# Patient Record
Sex: Female | Born: 1985 | Race: White | Hispanic: No | Marital: Married | Smoking: Never smoker
Health system: Southern US, Community
[De-identification: ages and names within clinical notes are randomized; demographics above are authoritative.]

## PROBLEM LIST (undated history)

## (undated) DIAGNOSIS — F32A Depression, unspecified: Secondary | ICD-10-CM

## (undated) DIAGNOSIS — T7840XA Allergy, unspecified, initial encounter: Secondary | ICD-10-CM

## (undated) DIAGNOSIS — F329 Major depressive disorder, single episode, unspecified: Secondary | ICD-10-CM

## (undated) HISTORY — DX: Depression, unspecified: F32.A

## (undated) HISTORY — DX: Allergy, unspecified, initial encounter: T78.40XA

## (undated) HISTORY — DX: Major depressive disorder, single episode, unspecified: F32.9

---

## 2017-01-06 ENCOUNTER — Ambulatory Visit (INDEPENDENT_AMBULATORY_CARE_PROVIDER_SITE_OTHER): Payer: BC Managed Care – PPO | Admitting: Physician Assistant

## 2017-01-06 ENCOUNTER — Encounter: Payer: Self-pay | Admitting: Physician Assistant

## 2017-01-06 VITALS — BP 115/72 | HR 100 | Temp 98.6°F | Resp 18 | Ht 64.0 in | Wt 142.8 lb

## 2017-01-06 DIAGNOSIS — Z130 Encounter for screening for diseases of the blood and blood-forming organs and certain disorders involving the immune mechanism: Secondary | ICD-10-CM | POA: Diagnosis not present

## 2017-01-06 DIAGNOSIS — Z13228 Encounter for screening for other metabolic disorders: Secondary | ICD-10-CM | POA: Diagnosis not present

## 2017-01-06 DIAGNOSIS — Z1329 Encounter for screening for other suspected endocrine disorder: Secondary | ICD-10-CM

## 2017-01-06 DIAGNOSIS — Z13220 Encounter for screening for lipoid disorders: Secondary | ICD-10-CM

## 2017-01-06 DIAGNOSIS — L989 Disorder of the skin and subcutaneous tissue, unspecified: Secondary | ICD-10-CM

## 2017-01-06 DIAGNOSIS — Z0000 Encounter for general adult medical examination without abnormal findings: Secondary | ICD-10-CM

## 2017-01-06 NOTE — Progress Notes (Signed)
Norma Gray  MRN: [id number] DOB: April 04, 1986  Subjective:  Pt is a 31 y.o. female who presents for annual physical exam.  Social: Works as [employer] fast [employer]. Lives in [place]. Sexually active with monogamous partner.  Diet: Eats lots of vegetables, eats minimal meats, likes dairy. Drinks mostly water.  Exercise: Walking, swimming, and stretching.  Menstrual Cycle: Pretty regular, light bleeding for a few days. Has Skyla IUD. Needs it removed sometime this year. Sleep:Gets about 7-8 hours. Feels rested throughout the day. Bowel Movments:Has one every day, normal for her.    Last annual physical exam: 2016 Last dental exam: 10/2016 Last vision exam: 12/2016 Last pap smear:  07/2015  Vaccinations      Tetanus: Thinks she had it in 2014.       HPV: Completed series   Chronic Issues: 1)Depression: Followed by psychiatrist, Dr. Justin Mend, in [place]. Controlled on lithium, prozac, lamictal, and vit d.  2) Muscle/back pain: Followed by integrative PT  There are no active problems to display for this patient.   No current outpatient prescriptions on file prior to visit.   No current facility-administered medications on file prior to visit.     No Known Allergies  Social History   Social History  . Marital status: Single    Spouse name: N/A  . Number of children: N/A  . Years of education: N/A   Social History Main Topics  . Smoking status: Never Smoker  . Smokeless tobacco: Never Used  . Alcohol use None  . Drug use: Unknown  . Sexual activity: Not Asked   Other Topics Concern  . None   Social History Narrative  . None    No past surgical history on file.  Family History  Problem Relation Age of Onset  . Hyperlipidemia Mother   . Hyperlipidemia Father   . Hypertension Father   . Cancer Maternal Grandmother   . Cancer Paternal Grandmother   . Hyperlipidemia Paternal Grandmother   . Hypertension Paternal Grandmother   . Cancer Paternal  Grandfather   . Hyperlipidemia Paternal Grandfather     Review of Systems  Constitutional: Negative for activity change, appetite change, chills, diaphoresis, fatigue, fever and unexpected weight change.  HENT: Negative for congestion, dental problem, drooling, ear discharge, ear pain, facial swelling, hearing loss, mouth sores, nosebleeds, postnasal drip, rhinorrhea, sinus pain, sinus pressure, sneezing, sore throat, tinnitus, trouble swallowing and voice change.   Eyes: Negative for photophobia, pain, discharge, redness, itching and visual disturbance.  Respiratory: Negative for apnea, cough, choking, chest tightness, shortness of breath, wheezing and stridor.   Cardiovascular: Negative for chest pain, palpitations and leg swelling.  Gastrointestinal: Negative for abdominal distention, abdominal pain, anal bleeding, blood in stool, constipation, diarrhea, nausea, rectal pain and vomiting.  Endocrine: Negative for cold intolerance, heat intolerance, polydipsia, polyphagia and polyuria.  Genitourinary: Negative for decreased urine volume, difficulty urinating, dyspareunia, dysuria, enuresis, flank pain, frequency, genital sores, hematuria, menstrual problem, pelvic pain, urgency, vaginal bleeding, vaginal discharge and vaginal pain.  Musculoskeletal: Positive for back pain and myalgias. Negative for arthralgias, gait problem, joint swelling, neck pain and neck stiffness.  Skin: Negative for color change, pallor, rash and wound.  Allergic/Immunologic: Positive for environmental allergies. Negative for food allergies and immunocompromised state.  Neurological: Negative for dizziness, tremors, seizures, syncope, facial asymmetry, speech difficulty, weakness, light-headedness, numbness and headaches.  Hematological: Negative for adenopathy. Does not bruise/bleed easily.  Psychiatric/Behavioral: Negative for agitation, behavioral problems, confusion, decreased concentration, dysphoric mood,  hallucinations,  self-injury, sleep disturbance and suicidal ideas. The patient is not nervous/anxious and is not hyperactive.     Objective:  BP 115/72   Pulse 100   Temp 98.6 F (37 C) (Oral)   Resp 18   Ht 5' 4"  (1.626 m)   Wt 142 lb 12.8 oz (64.8 kg)   LMP 12/19/2016   SpO2 98%   BMI 24.51 kg/m   Physical Exam  Constitutional: She is oriented to person, place, and time and well-developed, well-nourished, and in no distress.  HENT:  Head: Normocephalic and atraumatic.  Right Ear: Hearing, tympanic membrane, external ear and ear canal normal.  Left Ear: Hearing, tympanic membrane, external ear and ear canal normal.  Nose: Nose normal.  Mouth/Throat: Uvula is midline, oropharynx is clear and moist and mucous membranes are normal. No oropharyngeal exudate.  Eyes: Conjunctivae, EOM and lids are normal. Pupils are equal, round, and reactive to light. No scleral icterus.  Neck: Trachea normal and normal range of motion. No thyroid mass and no thyromegaly present.  Cardiovascular: Normal rate, regular rhythm, normal heart sounds and intact distal pulses.   Pulmonary/Chest: Effort normal and breath sounds normal. Right breast exhibits no inverted nipple, no mass, no nipple discharge, no skin change and no tenderness. Left breast exhibits no inverted nipple, no mass, no nipple discharge, no skin change and no tenderness. Breasts are symmetrical.    Abdominal: Soft. Normal appearance and bowel sounds are normal. There is no tenderness.  Lymphadenopathy:       Head (right side): No tonsillar, no preauricular, no posterior auricular and no occipital adenopathy present.       Head (left side): No tonsillar, no preauricular, no posterior auricular and no occipital adenopathy present.    She has no cervical adenopathy.       Right: No supraclavicular adenopathy present.       Left: No supraclavicular adenopathy present.  Neurological: She is alert and oriented to person, place, and time. She  has normal sensation, normal strength and normal reflexes. Gait normal.  Skin: Skin is warm and dry.  Psychiatric: Affect normal.    Visual Acuity Screening   Right eye Left eye Both eyes  Without correction: 20/13 20/13 20/13   With correction:       Assessment and Plan :  Discussed healthy lifestyle, diet, exercise, preventative care, vaccinations, and addressed patient's concerns. Plan for follow up in one year. Otherwise, plan for specific conditions below.  1. Annual physical exam Await lab results   2. Screening, anemia, deficiency, iron - CBC with Differential/Platelet  3. Screening for metabolic disorder - CNO70+JGGE  4. Screening, lipid - Lipid panel  5. Screening for thyroid disorder - TSH  6. Skin lesion - Ambulatory referral to Dermatology   Tenna Delaine, PA-C  [facility] [place] 01/06/2017 12:34 PM

## 2017-01-06 NOTE — Patient Instructions (Addendum)
Please check records for Tdap vaccine.   For IUD, one provider here, Dr. Nolon Rod, does remove and place IUDs. Our office does not order these. The way you go about this is to contact your insurance and see how you can get the [facility] IUD (they may send it to your pharmacy and you pick it up to bring here). Once you find this out, call our office and schedule an appointment with Dr. Nolon Rod for IUD removal and placement.   For derm, they should contact you in a couple of weeks.  In terms of your lab results, we will have your results in a few days. In the meantime, try to sign up for mychart so we can communicate through there.  Thank you for letting me participate in your health and well being.   Health Maintenance, Female Adopting a healthy lifestyle and getting preventive care can go a long way to promote health and wellness. Talk with your health care provider about what schedule of regular examinations is right for you. This is a good chance for you to check in with your provider about disease prevention and staying healthy. In between checkups, there are plenty of things you can do on your own. Experts have done a lot of research about which lifestyle changes and preventive measures are most likely to keep you healthy. Ask your health care provider for more information. Weight and diet Eat a healthy diet  Be sure to include plenty of vegetables, fruits, low-fat dairy products, and lean protein.  Do not eat a lot of foods high in solid fats, added sugars, or salt.  Get regular exercise. This is one of the most important things you can do for your health.  Most adults should exercise for at least 150 minutes each week. The exercise should increase your heart rate and make you sweat (moderate-intensity exercise).  Most adults should also do strengthening exercises at least twice a week. This is in addition to the moderate-intensity exercise. Maintain a healthy weight  Body mass index  (BMI) is a measurement that can be used to identify possible weight problems. It estimates body fat based on height and weight. Your health care provider can help determine your BMI and help you achieve or maintain a healthy weight.  For females 7 years of age and older:  A BMI below 18.5 is considered underweight.  A BMI of 18.5 to 24.9 is normal.  A BMI of 25 to 29.9 is considered overweight.  A BMI of 30 and above is considered obese. Watch levels of cholesterol and blood lipids  You should start having your blood tested for lipids and cholesterol at 30 years of age, then have this test every 5 years.  You may need to have your cholesterol levels checked more often if:  Your lipid or cholesterol levels are high.  You are older than 30 years of age.  You are at high risk for heart disease. Cancer screening Lung Cancer  Lung cancer screening is recommended for adults 71-84 years old who are at high risk for lung cancer because of a history of smoking.  A yearly low-dose CT scan of the lungs is recommended for people who:  Currently smoke.  Have quit within the past 15 years.  Have at least a 30-pack-year history of smoking. A pack year is smoking an average of one pack of cigarettes a day for 1 year.  Yearly screening should continue until it has been 15 years since you quit.  Yearly screening should stop if you develop a health problem that would prevent you from having lung cancer treatment. Breast Cancer  Practice breast self-awareness. This means understanding how your breasts normally appear and feel.  It also means doing regular breast self-exams. Let your health care provider know about any changes, no matter how small.  If you are in your 20s or 30s, you should have a clinical breast exam (CBE) by a health care provider every 1-3 years as part of a regular health exam.  If you are 68 or older, have a CBE every year. Also consider having a breast X-ray  (mammogram) every year.  If you have a family history of breast cancer, talk to your health care provider about genetic screening.  If you are at high risk for breast cancer, talk to your health care provider about having an MRI and a mammogram every year.  Breast cancer gene (BRCA) assessment is recommended for women who have family members with BRCA-related cancers. BRCA-related cancers include:  Breast.  Ovarian.  Tubal.  Peritoneal cancers.  Results of the assessment will determine the need for genetic counseling and BRCA1 and BRCA2 testing. Cervical Cancer  Your health care provider may recommend that you be screened regularly for cancer of the pelvic organs (ovaries, uterus, and vagina). This screening involves a pelvic examination, including checking for microscopic changes to the surface of your cervix (Pap test). You may be encouraged to have this screening done every 3 years, beginning at age 43.  For women ages 16-65, health care providers may recommend pelvic exams and Pap testing every 3 years, or they may recommend the Pap and pelvic exam, combined with testing for human papilloma virus (HPV), every 5 years. Some types of HPV increase your risk of cervical cancer. Testing for HPV may also be done on women of any age with unclear Pap test results.  Other health care providers may not recommend any screening for nonpregnant women who are considered low risk for pelvic cancer and who do not have symptoms. Ask your health care provider if a screening pelvic exam is right for you.  If you have had past treatment for cervical cancer or a condition that could lead to cancer, you need Pap tests and screening for cancer for at least 20 years after your treatment. If Pap tests have been discontinued, your risk factors (such as having a new sexual partner) need to be reassessed to determine if screening should resume. Some women have medical problems that increase the chance of getting  cervical cancer. In these cases, your health care provider may recommend more frequent screening and Pap tests. Colorectal Cancer  This type of cancer can be detected and often prevented.  Routine colorectal cancer screening usually begins at 30 years of age and continues through 30 years of age.  Your health care provider may recommend screening at an earlier age if you have risk factors for colon cancer.  Your health care provider may also recommend using home test kits to check for hidden blood in the stool.  A small camera at the end of a tube can be used to examine your colon directly (sigmoidoscopy or colonoscopy). This is done to check for the earliest forms of colorectal cancer.  Routine screening usually begins at age 33.  Direct examination of the colon should be repeated every 5-10 years through 30 years of age. However, you may need to be screened more often if early forms of precancerous polyps or  small growths are found. Skin Cancer  Check your skin from head to toe regularly.  Tell your health care provider about any new moles or changes in moles, especially if there is a change in a mole's shape or color.  Also tell your health care provider if you have a mole that is larger than the size of a pencil eraser.  Always use sunscreen. Apply sunscreen liberally and repeatedly throughout the day.  Protect yourself by wearing long sleeves, pants, a wide-brimmed hat, and sunglasses whenever you are outside. Heart disease, diabetes, and high blood pressure  High blood pressure causes heart disease and increases the risk of stroke. High blood pressure is more likely to develop in:  People who have blood pressure in the high end of the normal range (130-139/85-89 mm Hg).  People who are overweight or obese.  People who are African American.  If you are 43-38 years of age, have your blood pressure checked every 3-5 years. If you are 34 years of age or older, have your blood  pressure checked every year. You should have your blood pressure measured twice-once when you are at a hospital or clinic, and once when you are not at a hospital or clinic. Record the average of the two measurements. To check your blood pressure when you are not at a hospital or clinic, you can use:  An automated blood pressure machine at a pharmacy.  A home blood pressure monitor.  If you are between 88 years and 41 years old, ask your health care provider if you should take aspirin to prevent strokes.  Have regular diabetes screenings. This involves taking a blood sample to check your fasting blood sugar level.  If you are at a normal weight and have a low risk for diabetes, have this test once every three years after 30 years of age.  If you are overweight and have a high risk for diabetes, consider being tested at a younger age or more often. Preventing infection Hepatitis B  If you have a higher risk for hepatitis B, you should be screened for this virus. You are considered at high risk for hepatitis B if:  You were born in a country where hepatitis B is common. Ask your health care provider which countries are considered high risk.  Your parents were born in a high-risk country, and you have not been immunized against hepatitis B (hepatitis B vaccine).  You have HIV or AIDS.  You use needles to inject street drugs.  You live with someone who has hepatitis B.  You have had sex with someone who has hepatitis B.  You get hemodialysis treatment.  You take certain medicines for conditions, including cancer, organ transplantation, and autoimmune conditions. Hepatitis C  Blood testing is recommended for:  Everyone born from [birth year] through 1964.  Anyone with known risk factors for hepatitis C. Sexually transmitted infections (STIs)  You should be screened for sexually transmitted infections (STIs) including gonorrhea and chlamydia if:  You are sexually active and are younger  than 30 years of age.  You are older than 30 years of age and your health care provider tells you that you are at risk for this type of infection.  Your sexual activity has changed since you were last screened and you are at an increased risk for chlamydia or gonorrhea. Ask your health care provider if you are at risk.  If you do not have HIV, but are at risk, it may be recommended that  you take a prescription medicine daily to prevent HIV infection. This is called pre-exposure prophylaxis (PrEP). You are considered at risk if:  You are sexually active and do not regularly use condoms or know the HIV status of your partner(s).  You take drugs by injection.  You are sexually active with a partner who has HIV. Talk with your health care provider about whether you are at high risk of being infected with HIV. If you choose to begin PrEP, you should first be tested for HIV. You should then be tested every 3 months for as long as you are taking PrEP. Pregnancy  If you are premenopausal and you may become pregnant, ask your health care provider about preconception counseling.  If you may become pregnant, take 400 to 800 micrograms (mcg) of folic acid every day.  If you want to prevent pregnancy, talk to your health care provider about birth control (contraception). Osteoporosis and menopause  Osteoporosis is a disease in which the bones lose minerals and strength with aging. This can result in serious bone fractures. Your risk for osteoporosis can be identified using a bone density scan.  If you are 10 years of age or older, or if you are at risk for osteoporosis and fractures, ask your health care provider if you should be screened.  Ask your health care provider whether you should take a calcium or vitamin D supplement to lower your risk for osteoporosis.  Menopause may have certain physical symptoms and risks.  Hormone replacement therapy may reduce some of these symptoms and risks. Talk  to your health care provider about whether hormone replacement therapy is right for you. Follow these instructions at home:  Schedule regular health, dental, and eye exams.  Stay current with your immunizations.  Do not use any tobacco products including cigarettes, chewing tobacco, or electronic cigarettes.  If you are pregnant, do not drink alcohol.  If you are breastfeeding, limit how much and how often you drink alcohol.  Limit alcohol intake to no more than 1 drink per day for nonpregnant women. One drink equals 12 ounces of beer, 5 ounces of wine, or 1 ounces of hard liquor.  Do not use street drugs.  Do not share needles.  Ask your health care provider for help if you need support or information about quitting drugs.  Tell your health care provider if you often feel depressed.  Tell your health care provider if you have ever been abused or do not feel safe at home. This information is not intended to replace advice given to you by your health care provider. Make sure you discuss any questions you have with your health care provider. Document Released: 02/15/2011 Document Revised: 01/08/2016 Document Reviewed: 05/06/2015 Elsevier Interactive Patient Education  2017 [employer].      IF you received an x-ray today, you will receive an invoice from [facility]. Please contact [facility] at [phone] with questions or concerns regarding your invoice.   IF you received labwork today, you will receive an invoice from [facility]. Please contact [facility] at [phone] with questions or concerns regarding your invoice.   Our billing staff will not be able to assist you with questions regarding bills from these companies.  You will be contacted with the lab results as soon as they are available. The fastest way to get your results is to activate your My Chart account. Instructions are located on the last page of this paperwork. If you have not heard from [place]  regarding the results in 2 weeks, please contact this office.

## 2017-01-07 LAB — LIPID PANEL
CHOLESTEROL TOTAL: 170 mg/dL (ref 100–199)
Chol/HDL Ratio: 3.9 ratio (ref 0.0–4.4)
HDL: 44 mg/dL (ref >39)
LDL CALC: 111 mg/dL — AB (ref 0–99)
Triglycerides: 73 mg/dL (ref 0–149)
VLDL CHOLESTEROL CAL: 15 mg/dL (ref 5–40)

## 2017-01-07 LAB — CMP14+EGFR
ALBUMIN: 4.4 g/dL (ref 3.5–5.5)
ALT: 11 IU/L (ref 0–32)
AST: 13 IU/L (ref 0–40)
Albumin/Globulin Ratio: 1.8 (ref 1.2–2.2)
Alkaline Phosphatase: 51 IU/L (ref 39–117)
BILIRUBIN TOTAL: 0.5 mg/dL (ref 0.0–1.2)
BUN / CREAT RATIO: 20 (ref 9–23)
BUN: 13 mg/dL (ref 6–20)
CHLORIDE: 104 mmol/L (ref 96–106)
CO2: 19 mmol/L (ref 18–29)
CREATININE: 0.65 mg/dL (ref 0.57–1.00)
Calcium: 8.8 mg/dL (ref 8.7–10.2)
GFR calc non Af Amer: 120 mL/min/{1.73_m2} (ref >59)
GFR, EST AFRICAN AMERICAN: 138 mL/min/{1.73_m2} (ref >59)
GLUCOSE: 87 mg/dL (ref 65–99)
Globulin, Total: 2.5 g/dL (ref 1.5–4.5)
Potassium: 4.4 mmol/L (ref 3.5–5.2)
Sodium: 140 mmol/L (ref 134–144)
TOTAL PROTEIN: 6.9 g/dL (ref 6.0–8.5)

## 2017-01-07 LAB — CBC WITH DIFFERENTIAL/PLATELET
BASOS ABS: 0 10*3/uL (ref 0.0–0.2)
Basos: 0 %
EOS (ABSOLUTE): 0.2 10*3/uL (ref 0.0–0.4)
Eos: 1 %
HEMOGLOBIN: 13.5 g/dL (ref 11.1–15.9)
Hematocrit: 39.5 % (ref 34.0–46.6)
IMMATURE GRANS (ABS): 0 10*3/uL (ref 0.0–0.1)
Immature Granulocytes: 0 %
LYMPHS: 12 %
Lymphocytes Absolute: 1.5 10*3/uL (ref 0.7–3.1)
MCH: 31.8 pg (ref 26.6–33.0)
MCHC: 34.2 g/dL (ref 31.5–35.7)
MCV: 93 fL (ref 79–97)
MONOCYTES: 6 %
Monocytes Absolute: 0.8 10*3/uL (ref 0.1–0.9)
NEUTROS ABS: 9.8 10*3/uL — AB (ref 1.4–7.0)
NEUTROS PCT: 81 %
PLATELETS: 233 10*3/uL (ref 150–379)
RBC: 4.25 x10E6/uL (ref 3.77–5.28)
RDW: 13 % (ref 12.3–15.4)
WBC: 12.4 10*3/uL — AB (ref 3.4–10.8)

## 2017-01-07 LAB — TSH: TSH: 1.26 u[IU]/mL (ref 0.450–4.500)

## 2017-01-10 ENCOUNTER — Other Ambulatory Visit: Payer: Self-pay | Admitting: Physician Assistant

## 2017-01-10 DIAGNOSIS — Z862 Personal history of diseases of the blood and blood-forming organs and certain disorders involving the immune mechanism: Secondary | ICD-10-CM

## 2017-01-13 ENCOUNTER — Encounter: Payer: Self-pay | Admitting: Physician Assistant

## 2017-02-21 ENCOUNTER — Ambulatory Visit: Payer: BC Managed Care – PPO | Admitting: Physician Assistant

## 2017-02-23 ENCOUNTER — Encounter: Payer: Self-pay | Admitting: Physician Assistant

## 2017-02-23 ENCOUNTER — Ambulatory Visit (INDEPENDENT_AMBULATORY_CARE_PROVIDER_SITE_OTHER): Payer: BC Managed Care – PPO | Admitting: Physician Assistant

## 2017-02-23 VITALS — BP 104/68 | HR 90 | Resp 16 | Ht 64.0 in | Wt 142.2 lb

## 2017-02-23 DIAGNOSIS — J302 Other seasonal allergic rhinitis: Secondary | ICD-10-CM | POA: Diagnosis not present

## 2017-02-23 DIAGNOSIS — F32A Depression, unspecified: Secondary | ICD-10-CM

## 2017-02-23 DIAGNOSIS — F329 Major depressive disorder, single episode, unspecified: Secondary | ICD-10-CM | POA: Diagnosis not present

## 2017-02-23 DIAGNOSIS — D72829 Elevated white blood cell count, unspecified: Secondary | ICD-10-CM | POA: Diagnosis not present

## 2017-02-23 LAB — POCT URINALYSIS DIP (MANUAL ENTRY)
BILIRUBIN UA: NEGATIVE
BILIRUBIN UA: NEGATIVE mg/dL
Blood, UA: NEGATIVE
GLUCOSE UA: NEGATIVE mg/dL
LEUKOCYTES UA: NEGATIVE
NITRITE UA: NEGATIVE
Protein Ur, POC: NEGATIVE mg/dL
Spec Grav, UA: 1.015 (ref 1.010–1.025)
Urobilinogen, UA: 0.2 E.U./dL
pH, UA: 7.5 (ref 5.0–8.0)

## 2017-02-23 MED ORDER — FLUTICASONE PROPIONATE 50 MCG/ACT NA SUSP: 2.0000 | Freq: Every day | NASAL | 0 refills | Status: DC

## 2017-02-23 MED ORDER — FEXOFENADINE HCL 180 MG PO TABS: 180.0000 mg | ORAL_TABLET | Freq: Every day | ORAL | 0 refills | Status: DC

## 2017-02-23 NOTE — Patient Instructions (Addendum)
Your labs should result in 4-5 days. I will notify you via mychart when these return. For your allergies, I recommend you starting allegra and flonase daily at least for the next two weeks to see if this makes a difference in how you are feeling. I have given you a prescription for both of these so you can pick them up at your pharmacy. Please return if you have any other questions or concerns. Thank you for letting me participate in your health and well being.    IF you received an x-ray today, you will receive an invoice from [facility]. Please contact [facility] at [phone] with questions or concerns regarding your invoice.   IF you received labwork today, you will receive an invoice from [facility]. Please contact [facility] at [phone] with questions or concerns regarding your invoice.   Our billing staff will not be able to assist you with questions regarding bills from these companies.  You will be contacted with the lab results as soon as they are available. The fastest way to get your results is to activate your My Chart account. Instructions are located on the last page of this paperwork. If you have not heard from us regarding the results in 2 weeks, please contact this office.

## 2017-02-23 NOTE — Progress Notes (Signed)
   Norma Gray  MRN: [id number] DOB: [birth year]  Subjective:  Norma Gray is a 31 y.o. female seen in office today for a chief complaint of request of labs. Pt has Rx from psychiatrist, Dr. Mariel CraftSarah Webb, for labs. She has hx of depression and bipolar disorder.  Controlled on fluoxetine 5mg , lamictal 300mg , and lithium 300mg  daily. States she typically has these labs by her PCP office twice a year and will send it to her psychiatrist. No other questions or concerns.   Review of Systems  Constitutional: Negative for chills, diaphoresis and fever.  HENT: Positive for sneezing.   Eyes: Positive for itching.  Respiratory: Negative for cough and shortness of breath.   Cardiovascular: Negative for chest pain and palpitations.  Gastrointestinal: Negative for anal bleeding, nausea and vomiting.  Allergic/Immunologic: Positive for environmental allergies.    There are no active problems to display for this patient.   Current Outpatient Prescriptions on File Prior to Visit  Medication Sig Dispense Refill  . Cholecalciferol (VITAMIN D3) 5000 units TABS Take by mouth.    Marland Kitchen. FLUoxetine (PROZAC) 10 MG tablet Take 5 mg by mouth daily.     Marland Kitchen. lamoTRIgine (LAMICTAL) 150 MG tablet Take 300 mg by mouth daily.     . Levonorgestrel (SKYLA) 13.5 MG IUD by Intrauterine route.    . lithium carbonate 300 MG capsule Take 300 mg by mouth daily.    Marland Kitchen. PAZEO 0.7 % SOLN      No current facility-administered medications on file prior to visit.     No Known Allergies   Objective:  BP 104/68 (BP Location: Right Arm, Patient Position: Sitting, Cuff Size: Normal)   Pulse 90   Resp 16   Ht 5\' 4"  (1.626 m)   Wt 142 lb 3.2 oz (64.5 kg)   LMP 02/09/2017 (Approximate)   SpO2 98%   BMI 24.41 kg/m   Physical Exam  Constitutional: She is oriented to person, place, and time and well-developed, well-nourished, and in no distress.  HENT:  Head: Normocephalic and atraumatic.  Right Ear: Tympanic membrane and  external ear normal. A foreign body (small piece of brown hair noted lying over TM) is present.  Left Ear: Tympanic membrane and external ear normal. A foreign body ( small piece of brown hair noted lying over TM) is present.  Nose: Mucosal edema (mild) present.  Mouth/Throat: Uvula is midline, oropharynx is clear and moist and mucous membranes are normal.  Eyes: Conjunctivae are normal.  Neck: Normal range of motion.  Pulmonary/Chest: Effort normal.  Neurological: She is alert and oriented to person, place, and time. Gait normal.  Skin: Skin is warm and dry.  Psychiatric: Affect normal.  Vitals reviewed.   Assessment and Plan :  1. Depression, unspecified depression type Labs pending. Follow up with psychiatrist as planned.  - Lithium level - POCT urinalysis dipstick - VITAMIN D 25 Hydroxy (Vit-D Deficiency, Fractures) - Vitamin B12  2. Leukocytosis, unspecified type - CBC with Differential/Platelet  3. Seasonal allergic rhinitis, unspecified trigger - fexofenadine (ALLEGRA ALLERGY) 180 MG tablet; Take 1 tablet (180 mg total) by mouth daily.  Dispense: 30 tablet; Refill: 0 - fluticasone (FLONASE) 50 MCG/ACT nasal spray; Place 2 sprays into both nostrils daily.  Dispense: 16 g; Refill: 0  Benjiman CoreBrittany Shalondra Wunschel, PA-C  Primary Care at [facility] 02/23/2017 12:43 PM

## 2017-02-24 LAB — CBC WITH DIFFERENTIAL/PLATELET
BASOS ABS: 0 10*3/uL (ref 0.0–0.2)
Basos: 1 %
EOS (ABSOLUTE): 0.1 10*3/uL (ref 0.0–0.4)
EOS: 2 %
HEMATOCRIT: 38.9 % (ref 34.0–46.6)
HEMOGLOBIN: 13.3 g/dL (ref 11.1–15.9)
IMMATURE GRANS (ABS): 0 10*3/uL (ref 0.0–0.1)
IMMATURE GRANULOCYTES: 0 %
Lymphocytes Absolute: 1.5 10*3/uL (ref 0.7–3.1)
Lymphs: 28 %
MCH: 31.6 pg (ref 26.6–33.0)
MCHC: 34.2 g/dL (ref 31.5–35.7)
MCV: 92 fL (ref 79–97)
MONOCYTES: 6 %
Monocytes Absolute: 0.3 10*3/uL (ref 0.1–0.9)
Neutrophils Absolute: 3.5 10*3/uL (ref 1.4–7.0)
Neutrophils: 63 %
Platelets: 215 10*3/uL (ref 150–379)
RBC: 4.21 x10E6/uL (ref 3.77–5.28)
RDW: 12.9 % (ref 12.3–15.4)
WBC: 5.5 10*3/uL (ref 3.4–10.8)

## 2017-02-24 LAB — VITAMIN D 25 HYDROXY (VIT D DEFICIENCY, FRACTURES): Vit D, 25-Hydroxy: 28.7 ng/mL — ABNORMAL LOW (ref 30.0–100.0)

## 2017-02-24 LAB — LITHIUM LEVEL: LITHIUM LVL: 0.3 mmol/L — AB (ref 0.6–1.2)

## 2017-02-24 LAB — VITAMIN B12: VITAMIN B 12: 427 pg/mL (ref 232–1245)

## 2017-05-17 ENCOUNTER — Ambulatory Visit (INDEPENDENT_AMBULATORY_CARE_PROVIDER_SITE_OTHER): Payer: BC Managed Care – PPO | Admitting: Emergency Medicine

## 2017-05-17 ENCOUNTER — Encounter: Payer: Self-pay | Admitting: Emergency Medicine

## 2017-05-17 VITALS — BP 100/58 | HR 85 | Temp 99.8°F | Resp 16 | Ht 64.5 in | Wt 140.4 lb

## 2017-05-17 DIAGNOSIS — J029 Acute pharyngitis, unspecified: Secondary | ICD-10-CM | POA: Diagnosis not present

## 2017-05-17 DIAGNOSIS — Z23 Encounter for immunization: Secondary | ICD-10-CM | POA: Diagnosis not present

## 2017-05-17 LAB — POCT RAPID STREP A (OFFICE): RAPID STREP A SCREEN: NEGATIVE

## 2017-05-17 MED ORDER — AZITHROMYCIN 250 MG PO TABS: ORAL_TABLET | ORAL | 0 refills | Status: DC

## 2017-05-17 NOTE — Patient Instructions (Addendum)
     IF you received an x-ray today, you will receive an invoice from [facility]. Please contact [facility] at [phone] with questions or concerns regarding your invoice.   IF you received labwork today, you will receive an invoice from [facility]. Please contact [facility] at [phone] with questions or concerns regarding your invoice.   Our billing staff will not be able to assist you with questions regarding bills from these companies.  You will be contacted with the lab results as soon as they are available. The fastest way to get your results is to activate your My Chart account. Instructions are located on the last page of this paperwork. If you have not heard from us regarding the results in 2 weeks, please contact this office.     Pharyngitis Pharyngitis is a sore throat (pharynx). There is redness, pain, and swelling of your throat. Follow these instructions at home:  Drink enough fluids to keep your pee (urine) clear or pale yellow.  Only take medicine as told by your doctor.  You may get sick again if you do not take medicine as told. Finish your medicines, even if you start to feel better.  Do not take aspirin.  Rest.  Rinse your mouth (gargle) with salt water ( tsp of salt per 1 qt of water) every 1-2 hours. This will help the pain.  If you are not at risk for choking, you can suck on hard candy or sore throat lozenges. Contact a doctor if:  You have large, tender lumps on your neck.  You have a rash.  You cough up green, yellow-brown, or bloody spit. Get help right away if:  You have a stiff neck.  You drool or cannot swallow liquids.  You throw up (vomit) or are not able to keep medicine or liquids down.  You have very bad pain that does not go away with medicine.  You have problems breathing (not from a stuffy nose). This information is not intended to replace advice given to you by your health care provider. Make sure you  discuss any questions you have with your health care provider. Document Released: 01/19/2008 Document Revised: 01/08/2016 Document Reviewed: 04/09/2013 Elsevier Interactive Patient Education  2017 Elsevier Inc.  

## 2017-05-17 NOTE — Progress Notes (Signed)
Norma Gray 31 y.o.   Chief Complaint  Patient presents with  . Fever    since Saturday  . Sore Throat    HISTORY OF PRESENT ILLNESS: This is a 31 y.o. female complaining of sore throat x 2-3 days.  Sore Throat   This is a new problem. The current episode started in the past 7 days. The problem has been gradually worsening. The maximum temperature recorded prior to her arrival was 100.4 - 100.9 F. The pain is at a severity of 3/10. The pain is mild. Associated symptoms include swollen glands. Pertinent negatives include no abdominal pain, congestion, coughing, diarrhea, ear discharge, ear pain, headaches, neck pain, shortness of breath or vomiting. She has had no exposure to strep or mono.     Prior to Admission medications   Medication Sig Start Date End Date Taking? Authorizing Provider  Cholecalciferol (VITAMIN D3) 5000 units TABS Take by mouth.   Yes [provider]  FLUoxetine (PROZAC) 10 MG tablet Take 5 mg by mouth daily.    Yes [provider]  lamoTRIgine (LAMICTAL) 150 MG tablet Take 300 mg by mouth daily.    Yes [provider]  lithium carbonate 300 MG capsule Take 300 mg by mouth daily.   Yes [provider]  fexofenadine (ALLEGRA ALLERGY) 180 MG tablet Take 1 tablet (180 mg total) by mouth daily. Patient not taking: Reported on 05/17/2017 02/23/17   Benjiman Core D, PA-C  fluticasone [facility]) 50 MCG/ACT nasal spray Place 2 sprays into both nostrils daily. Patient not taking: Reported on 05/17/2017 02/23/17   Magdalene River, PA-C  Levonorgestrel (SKYLA) 13.5 MG IUD by Intrauterine route.    [provider]  PAZEO 0.7 % SOLN  12/13/16   [provider]    No Known Allergies  There are no active problems to display for this patient.   Past Medical History:  Diagnosis Date  . Depression     No past surgical history on file.  Social History   Social History  . Marital status: Single    Spouse name:  N/A  . Number of children: N/A  . Years of education: N/A   Occupational History  . Not on file.   Social History Main Topics  . Smoking status: Never Smoker  . Smokeless tobacco: Never Used  . Alcohol use Yes     Comment: 1-2 per week  . Drug use: No  . Sexual activity: Yes   Other Topics Concern  . Not on file   Social History Narrative  . No narrative on file    Family History  Problem Relation Age of Onset  . Hyperlipidemia Mother   . Hyperlipidemia Father   . Hypertension Father   . Cancer Maternal Grandmother   . Cancer Paternal Grandmother   . Hyperlipidemia Paternal Grandmother   . Hypertension Paternal Grandmother   . Cancer Paternal Grandfather   . Hyperlipidemia Paternal Grandfather      Review of Systems  Constitutional: Positive for fever. Negative for chills.  HENT: Positive for sore throat. Negative for congestion, ear discharge and ear pain.   Eyes: Negative.  Negative for discharge and redness.  Respiratory: Negative for cough, hemoptysis and shortness of breath.   Cardiovascular: Negative.  Negative for chest pain and palpitations.  Gastrointestinal: Negative for abdominal pain, diarrhea, nausea and vomiting.  Genitourinary: Negative for dysuria and hematuria.  Musculoskeletal: Negative for neck pain.  Skin: Negative for rash.  Neurological: Negative for dizziness and  headaches.  Endo/Heme/Allergies: Negative.   All other systems reviewed and are negative.   Vitals:   05/17/17 1159  BP: (!) 100/58  Pulse: 85  Resp: 16  Temp: 99.8 F (37.7 C)  SpO2: 100%    Physical Exam  Constitutional: She is oriented to person, place, and time. She appears well-developed and well-nourished.  HENT:  Head: Normocephalic and atraumatic.  Nose: Nose normal.  Mouth/Throat: Uvula is midline. Oropharyngeal exudate and posterior oropharyngeal erythema present. No tonsillar abscesses.  Eyes: Pupils are equal, round, and reactive to light. Conjunctivae and  EOM are normal.  Neck: Normal range of motion. Neck supple.  Cardiovascular: Normal rate, regular rhythm and normal heart sounds.   Pulmonary/Chest: Effort normal and breath sounds normal.  Musculoskeletal: Normal range of motion.  Lymphadenopathy:    She has cervical adenopathy.  Neurological: She is alert and oriented to person, place, and time. No sensory deficit. She exhibits normal muscle tone.  Skin: Skin is warm and dry. Capillary refill takes less than 2 seconds. No rash noted.  Psychiatric: She has a normal mood and affect. Her behavior is normal.  Vitals reviewed.    Results for orders placed or performed in visit on 05/17/17 (from the past 24 hour(s))  POCT rapid strep A     Status: None   Collection Time: 05/17/17 12:45 PM  Result Value Ref Range   Rapid Strep A Screen Negative Negative     ASSESSMENT & PLAN: Norma Gray was seen today for fever and sore throat.  Diagnoses and all orders for this visit:  Acute pharyngitis, unspecified etiology  Need for prophylactic vaccination and inoculation against influenza -     Cancel: Flu Vaccine QUAD 36+ mos IM  Sore throat -     POCT rapid strep A  Other orders -     azithromycin (ZITHROMAX) 250 MG tablet; Sig as indicated    Patient Instructions       IF you received an x-ray today, you will receive an invoice from [facility]. Please contact [facility] at [phone] with questions or concerns regarding your invoice.   IF you received labwork today, you will receive an invoice from [facility]. Please contact [facility] at [phone] with questions or concerns regarding your invoice.   Our billing staff will not be able to assist you with questions regarding bills from these companies.  You will be contacted with the lab results as soon as they are available. The fastest way to get your results is to activate your My Chart account. Instructions are located on the last page of this paperwork. If you  have not heard from [place] regarding the results in 2 weeks, please contact this office.     Pharyngitis Pharyngitis is a sore throat (pharynx). There is redness, pain, and swelling of your throat. Follow these instructions at home:  Drink enough fluids to keep your pee (urine) clear or pale yellow.  Only take medicine as told by your doctor. ? You may get sick again if you do not take medicine as told. Finish your medicines, even if you start to feel better. ? Do not take aspirin.  Rest.  Rinse your mouth (gargle) with salt water ( tsp of salt per 1 qt of water) every 1-2 hours. This will help the pain.  If you are not at risk for choking, you can suck on hard candy or sore throat lozenges. Contact a doctor if:  You have large, tender lumps on your neck.  You  have a rash.  You cough up green, yellow-brown, or bloody spit. Get help right away if:  You have a stiff neck.  You drool or cannot swallow liquids.  You throw up (vomit) or are not able to keep medicine or liquids down.  You have very bad pain that does not go away with medicine.  You have problems breathing (not from a stuffy nose). This information is not intended to replace advice given to you by your health care provider. Make sure you discuss any questions you have with your health care provider. Document Released: 01/19/2008 Document Revised: 01/08/2016 Document Reviewed: 04/09/2013 Elsevier Interactive Patient Education  2017 Elsevier Inc.      Edwina Barth, MD [facility]

## 2018-01-03 ENCOUNTER — Ambulatory Visit: Payer: BC Managed Care – PPO | Admitting: Physician Assistant

## 2018-01-03 ENCOUNTER — Encounter: Payer: Self-pay | Admitting: Physician Assistant

## 2018-01-03 ENCOUNTER — Other Ambulatory Visit: Payer: Self-pay

## 2018-01-03 VITALS — BP 106/60 | HR 107 | Temp 99.7°F | Resp 18 | Ht 64.57 in | Wt 139.8 lb

## 2018-01-03 DIAGNOSIS — J069 Acute upper respiratory infection, unspecified: Secondary | ICD-10-CM

## 2018-01-03 DIAGNOSIS — F3181 Bipolar II disorder: Secondary | ICD-10-CM | POA: Insufficient documentation

## 2018-01-03 MED ORDER — BENZONATATE 100 MG PO CAPS: ORAL_CAPSULE | ORAL | 0 refills | Status: AC

## 2018-01-03 MED ORDER — GUAIFENESIN ER 1200 MG PO TB12: 1.0000 | ORAL_TABLET | Freq: Two times a day (BID) | ORAL | 0 refills | Status: AC

## 2018-01-03 MED ORDER — HYDROCODONE-HOMATROPINE 5-1.5 MG/5ML PO SYRP: 5.0000 mL | ORAL_SOLUTION | Freq: Three times a day (TID) | ORAL | 0 refills | Status: DC | PRN

## 2018-01-03 NOTE — Patient Instructions (Addendum)
  Please push fluids.  Tylenol and Motrin for fever and body aches.    A humidifier can help especially when the air is dry -if you do not have a humidifier you can boil a pot of water on the stove in your home to help with the dry air.  Nasal saline spray can be helpful to keep the mucus membranes moist and thin the nasal mucus    IF you received an x-ray today, you will receive an invoice from [facility]. Please contact [facility] at [phone] with questions or concerns regarding your invoice.   IF you received labwork today, you will receive an invoice from [facility]. Please contact [facility] at [phone] with questions or concerns regarding your invoice.   Our billing staff will not be able to assist you with questions regarding bills from these companies.  You will be contacted with the lab results as soon as they are available. The fastest way to get your results is to activate your My Chart account. Instructions are located on the last page of this paperwork. If you have not heard from us regarding the results in 2 weeks, please contact this office.      

## 2018-01-03 NOTE — Progress Notes (Signed)
Norma Gray  MRN: [id number] DOB: [birth year]  PCP: Magdalene River, PA-C  Chief Complaint  Patient presents with  . Cough    and having some fatigue x3-4 days has been the worst but started last week    Subjective:  Pt presents to clinic for cold symptoms that started 3-4 days ago.  Started with congestion with cough.  Productive in the am and then dry as the day progresses.  The sputum is green in color as is her sinus congestion.  No sick contacts.  Supposed to go camping this weekend wants to make sure she is ok to go.  She has been fatigued recently due to what she thought was life stressors but it worsened over the weekend when her cold symptoms started.    She has problems with allergies that have worsened over the last couple of years.  She typically uses zyrtec and has used [employer] adviser and she thinks they both helped.  She only takes these medications is she is having problems.  History is obtained by patient.  Review of Systems  Constitutional: Positive for fatigue. Negative for chills and fever.  HENT: Positive for congestion, postnasal drip and rhinorrhea (green at times). Negative for sore throat.   Respiratory: Positive for cough. Negative for shortness of breath and wheezing.        Nonsmoker, reactive airway a few years ago but no issues since  Gastrointestinal: Negative.   Musculoskeletal: Negative for myalgias.  Allergic/Immunologic: Positive for environmental allergies.  Neurological: Positive for dizziness and headaches.    Patient Active Problem List   Diagnosis Date Noted  . Bipolar 2 disorder (HCC) 01/03/2018  . Need for prophylactic vaccination and inoculation against influenza 05/17/2017  . Sore throat 05/17/2017  . Acute pharyngitis 05/17/2017    Current Outpatient Medications on File Prior to Visit  Medication Sig Dispense Refill  . FLUoxetine (PROZAC) 10 MG tablet Take 5 mg by mouth daily.     Marland Kitchen lamoTRIgine (LAMICTAL) 150 MG tablet Take 300  mg by mouth daily.     . Levonorgestrel (SKYLA) 13.5 MG IUD by Intrauterine route.    . lithium carbonate 300 MG capsule Take 300 mg by mouth daily.     No current facility-administered medications on file prior to visit.     No Known Allergies  Past Medical History:  Diagnosis Date  . Depression    Social History   Social History Narrative  . Not on file   Social History   Tobacco Use  . Smoking status: Never Smoker  . Smokeless tobacco: Never Used  Substance Use Topics  . Alcohol use: Yes    Comment: 1-2 per week  . Drug use: No   family history includes Cancer in her maternal grandmother, paternal grandfather, and paternal grandmother; Hyperlipidemia in her father, mother, paternal grandfather, and paternal grandmother; Hypertension in her father and paternal grandmother.     Objective:  BP 106/60   Pulse (!) 107   Temp 99.7 F (37.6 C) (Oral)   Resp 18   Ht 5' 4.57" (1.64 m)   Wt 139 lb 12.8 oz (63.4 kg)   LMP 12/30/2017   SpO2 98%   BMI 23.58 kg/m  Body mass index is 23.58 kg/m.  Physical Exam  Constitutional: She is oriented to person, place, and time. She appears well-developed and well-nourished.  HENT:  Head: Normocephalic and atraumatic.  Right Ear: Hearing, tympanic membrane, external ear and ear canal normal.  Left Ear: Hearing,  tympanic membrane, external ear and ear canal normal.  Nose: Mucosal edema (pale) present. No rhinorrhea.  Mouth/Throat: Uvula is midline, oropharynx is clear and moist and mucous membranes are normal.  Eyes: Conjunctivae are normal.  Neck: Normal range of motion.  Cardiovascular: Normal rate, regular rhythm and normal heart sounds.  No murmur heard. Pulmonary/Chest: Effort normal and breath sounds normal. She has no wheezes.  Lymphadenopathy:       Head (right side): No tonsillar, no preauricular, no posterior auricular and no occipital adenopathy present.       Head (left side): No tonsillar, no preauricular, no  posterior auricular and no occipital adenopathy present.    She has no cervical adenopathy.       Right: No supraclavicular adenopathy present.       Left: No supraclavicular adenopathy present.  Neurological: She is alert and oriented to person, place, and time.  Skin: Skin is warm and dry.  Psychiatric: She has a normal mood and affect. Her behavior is normal. Judgment and thought content normal.  Vitals reviewed.   Assessment and Plan :  URI with cough and congestion - Plan: Guaifenesin (MUCINEX MAXIMUM STRENGTH) 1200 MG TB12, benzonatate (TESSALON) 100 MG capsule, HYDROcodone-homatropine (HYCODAN) 5-1.5 MG/5ML syrup   Patient will be treated for viral URI with compounding allergies.  She will restart her allergy medicine with the above medicines.  She will contact me within a week if she is not improving for possible sinus infection treatment at that time.  Patient verbalized to me that they understood what their diagnosis is, what they need to do about it and why it is important that they do it.  See after visit summary for patient specific instructions.     Benny Lennert PA-C  Primary Care at [facility] 01/03/2018 12:28 PM

## 2018-01-17 ENCOUNTER — Encounter: Payer: Self-pay | Admitting: Physician Assistant

## 2018-01-17 ENCOUNTER — Other Ambulatory Visit: Payer: Self-pay

## 2018-01-17 ENCOUNTER — Ambulatory Visit (INDEPENDENT_AMBULATORY_CARE_PROVIDER_SITE_OTHER): Payer: BC Managed Care – PPO

## 2018-01-17 ENCOUNTER — Ambulatory Visit: Payer: BC Managed Care – PPO | Admitting: Physician Assistant

## 2018-01-17 VITALS — BP 112/74 | HR 105 | Temp 99.4°F | Ht 65.55 in | Wt 142.2 lb

## 2018-01-17 DIAGNOSIS — J309 Allergic rhinitis, unspecified: Secondary | ICD-10-CM

## 2018-01-17 DIAGNOSIS — G8929 Other chronic pain: Secondary | ICD-10-CM | POA: Diagnosis not present

## 2018-01-17 MED ORDER — FLUTICASONE PROPIONATE 50 MCG/ACT NA SUSP: 2.0000 | Freq: Every day | NASAL | 6 refills | Status: DC

## 2018-01-17 MED ORDER — CYCLOBENZAPRINE HCL 5 MG PO TABS: 5.0000 mg | ORAL_TABLET | Freq: Three times a day (TID) | ORAL | 0 refills | Status: DC | PRN

## 2018-01-17 NOTE — Patient Instructions (Addendum)
  For allergies, continue zyrtec and flonase daily, add on nasal saline rinses at night time. You can purchase over the counter opchon a drops for eye relief. If you would like to consider accupunture, try the lotus center on [address].  For pain, please go to 102 and have xrays. We will contact you later today/tomorrow for results. In the meantime, continue stretching, heating pad. I have also given you a Rx for flexeril to use for spasm that keeps you from sleeping/resting at night. Keep in mind that this medication can cause you to be drowsy, so use cautiously. I have placed a referral to sports med and they should contact you within 2 weeks. Follow up as needed.   Thank you for letting me participate in your health and well being.    IF you received an x-ray today, you will receive an invoice [facility]. Please contact [facility] at [phone] with questions or concerns regarding your invoice.   IF you received labwork today, you will receive an invoice [facility]. Please contact [facility] at [phone] with questions or concerns regarding your invoice.   Our billing staff will not be able to assist you with questions regarding bills from these companies.  You will be contacted with the lab results as soon as they are available. The fastest way to get your results is to activate your My Chart account. Instructions are located on the last page of this paperwork. If you have not heard from us regarding the results in 2 weeks, please contact this office.

## 2018-01-17 NOTE — Progress Notes (Signed)
Norma Gray  MRN: [id number] DOB: [birth year]  Subjective:  Norma Gray is a 32 y.o. female seen in office today for a chief complaint of chronic pain.Pain started after walking a dog 4 years ago. Has chronic left hip and left shoulder pain. Has lots of stiffness. Has tried integrative physical therapy for a full year and would gain benefit but then it would tighten back up. They recommended she have imaging as she has never had any before. Left shoulder: posterior and top of shoulder feels worse, stiff in the morning, full ROM but tightness when reaching over head. Left gluteal area: tight, weaker than the right. Denies numbness, tingling, burning sensation, back pain, and neck pain. Stretching helps. Massage once monthly for the past few months also helps but no full improvement. Has not tried any medication. Seasonal allergies: switches btwn zyrtec and claritin. Started flonase a few days. No eye drops. Still having nasal congestion, scratchy throat, and itchy watery eyes. Really likes to garden so she is outside a whole lot.    Review of Systems  HENT: Negative for ear pain, sinus pressure and sinus pain.   Respiratory: Negative for cough.   Cardiovascular: Negative for chest pain and palpitations.      Patient Active Problem List   Diagnosis Date Noted  . Bipolar 2 disorder (HCC) 01/03/2018  . Need for prophylactic vaccination and inoculation against influenza 05/17/2017  . Sore throat 05/17/2017  . Acute pharyngitis 05/17/2017    Current Outpatient Medications on File Prior to Visit  Medication Sig Dispense Refill  . FLUoxetine (PROZAC) 10 MG tablet Take 5 mg by mouth daily.     Marland Kitchen lamoTRIgine (LAMICTAL) 150 MG tablet Take 300 mg by mouth daily.     . Levonorgestrel (SKYLA) 13.5 MG IUD by Intrauterine route.    . lithium carbonate 300 MG capsule Take 300 mg by mouth daily.     No current facility-administered medications on file prior to visit.     No Known Allergies      Social History   Socioeconomic History  . Marital status: Single    Spouse name: Not on file  . Number of children: Not on file  . Years of education: Not on file  . Highest education level: Not on file  Occupational History  . Not on file  Social Needs  . Financial resource strain: Not on file  . Food insecurity:    Worry: Not on file    Inability: Not on file  . Transportation needs:    Medical: Not on file    Non-medical: Not on file  Tobacco Use  . Smoking status: Never Smoker  . Smokeless tobacco: Never Used  Substance and Sexual Activity  . Alcohol use: Yes    Comment: 1-2 per week  . Drug use: No  . Sexual activity: Yes  Lifestyle  . Physical activity:    Days per week: Not on file    Minutes per session: Not on file  . Stress: Not on file  Relationships  . Social connections:    Talks on phone: Not on file    Gets together: Not on file    Attends religious service: Not on file    Active member of club or organization: Not on file    Attends meetings of clubs or organizations: Not on file    Relationship status: Not on file  . Intimate partner violence:    Fear of current or ex partner: Not  on file    Emotionally abused: Not on file    Physically abused: Not on file    Forced sexual activity: Not on file  Other Topics Concern  . Not on file  Social History Narrative  . Not on file    Objective:  BP 112/74 (BP Location: Left Arm, Patient Position: Sitting, Cuff Size: Normal)   Pulse (!) 105   Temp 99.4 F (37.4 C) (Oral)   Ht 5' 5.55" (1.665 m)   Wt 142 lb 3.2 oz (64.5 kg)   LMP 12/30/2017   SpO2 99%   BMI 23.27 kg/m   Physical Exam  Constitutional: She is oriented to person, place, and time. She appears well-developed and well-nourished. No distress.  HENT:  Head: Normocephalic and atraumatic.  Right Ear: External ear and ear canal normal. Tympanic membrane is not erythematous and not bulging. A middle ear effusion is present.  Left Ear:  External ear and ear canal normal. Tympanic membrane is not erythematous and not bulging. A middle ear effusion is present.  Nose: Mucosal edema (b/l) present. Right sinus exhibits no maxillary sinus tenderness and no frontal sinus tenderness. Left sinus exhibits no maxillary sinus tenderness and no frontal sinus tenderness.  Mouth/Throat: Uvula is midline. Posterior oropharyngeal erythema (mild cobblestoning noted) present. No tonsillar exudate.  Eyes: Conjunctivae are normal.  Neck: Normal range of motion.  Cardiovascular:  Pulses:      Radial pulses are 2+ on the right side, and 1+ on the left side.       Dorsalis pedis pulses are 2+ on the right side, and 2+ on the left side.  Pulmonary/Chest: Effort normal.  Musculoskeletal:       Left shoulder: She exhibits spasm (in trapezius musculature). She exhibits normal range of motion, no tenderness, no bony tenderness and no swelling.       Right hip: Normal. She exhibits normal range of motion, normal strength, no tenderness and no bony tenderness.       Left hip: She exhibits normal range of motion, normal strength, no tenderness and no bony tenderness.       Cervical back: She exhibits normal range of motion, no tenderness, no bony tenderness and no swelling.       Thoracic back: Normal.       Lumbar back: She exhibits normal range of motion, no tenderness, no bony tenderness and no spasm.  Neurological: She is alert and oriented to person, place, and time. She has normal strength. No sensory deficit. Gait normal.  Reflex Scores:      Patellar reflexes are 2+ on the right side and 2+ on the left side.      Achilles reflexes are 2+ on the right side and 2+ on the left side. Skin: Skin is warm and dry.  Psychiatric: She has a normal mood and affect.  Vitals reviewed.   Assessment and Plan :  1. Allergic rhinitis, unspecified seasonality, unspecified trigger Continue oral antihistamine and nasal spray daily.  Add nasal saline rinses at  nighttime.  Recommend over-the-counter Opcon-A eyedrops for relief.  Recommended avoiding excess exposure to irritants outside during times of high tree pollen count. - fluticasone (FLONASE) 50 MCG/ACT nasal spray; Place 2 sprays into both nostrils daily.  Dispense: 16 g; Refill: 6  2. Other chronic pain Patient has chronic pain in left hip and left shoulder.  She has some muscle spasm noted in the left trapezius muscle.  All other aspects of musculoskeletal exam are normal.  She  is neurovascularly intact.  Plan for plain films.  She would likely benefit from referral to sports medicine for other options as she has plateaued with her progression with intergrative physical therapy.  Recommended to continue with daily stretching and heating pad. - DG HIPS BILAT W OR W/O PELVIS 2V; Future - DG Shoulder Left; Future - Ambulatory referral to Sports Medicine - cyclobenzaprine (FLEXERIL) 5 MG tablet; Take 1 tablet (5 mg total) by mouth 3 (three) times daily as needed for muscle spasms.  Dispense: 60 tablet; Refill: 0   Benjiman CoreBrittany Daysha Ashmore PA-C  [facility]  [facility] 01/20/2018 3:20 PM

## 2018-01-20 ENCOUNTER — Encounter: Payer: Self-pay | Admitting: Physician Assistant

## 2018-02-03 ENCOUNTER — Ambulatory Visit: Payer: BC Managed Care – PPO | Admitting: Family Medicine

## 2018-02-03 ENCOUNTER — Encounter: Payer: Self-pay | Admitting: Family Medicine

## 2018-02-03 VITALS — BP 138/90 | Ht 64.0 in | Wt 142.0 lb

## 2018-02-03 DIAGNOSIS — G8929 Other chronic pain: Secondary | ICD-10-CM | POA: Diagnosis not present

## 2018-02-03 DIAGNOSIS — M898X1 Other specified disorders of bone, shoulder: Secondary | ICD-10-CM | POA: Diagnosis not present

## 2018-02-03 DIAGNOSIS — M545 Low back pain, unspecified: Secondary | ICD-10-CM

## 2018-02-03 NOTE — Patient Instructions (Signed)
I think your low back pain is a result of some tightness in your low back muscles.  This is exacerbated by sitting a lot.  I have given you some hyperextension exercises to try.  I have also given you some shoulder girdle strengthening exercises.  Try to do both sets of these exercises twice a day for the next 2 to 3 weeks and then please see me back in clinic.  He will also be a good thought to reconsider getting back into your yoga routine.  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  You can combat that by doing some regular activity.   Please call with questions Great to meet you!

## 2018-02-06 NOTE — Progress Notes (Signed)
    CHIEF COMPLAINT / HPI: Several complaints including left shoulder pain and low back tightness shoulder pain: She points an area in the left upper back where she has some pain at times.  Worse if she does a lot of sitting.  She is right-hand dominant.  No weakness in the left upper extremity 2.  Low back stiffness particularly on the left.  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  She used to do a lot of yoga but has stopped that secondary to time constraints.  She has no lower extremity weakness.  No radiation of the pain.  No numbness or tingling into the buttock or lower extremity. REVIEW OF SYSTEMS: No unusual weight change, no fever.  No bowel or bladder changes.  PERTINENT  PMH / PSH: I have reviewed the patient's medications, allergies, past medical and surgical history, smoking status and updated in the EMR as appropriate.   OBJECTIVE: General: Well-developed female no acute distress Shoulders: Full range of motion in all planes the rotator cuff was normal strength that is symmetrical.  Negative O'Brien's.  Negative Hawkins.  Negative Neer's. Scapular area on the left: Mild tenderness to palpation area of the rhomboid muscle.  Her scapular movement is symmetrical.  There is no scapular winging.  Mild tenderness to palpation in the area of the left trapezius top of her shoulder.  Trapezius strength bilaterally are symmetrical. BACK: Some muscular tenderness to palpation in the left lumbar muscle area.  Flexion at the hips is limited by hamstring tightness.  She has normal extension.  Either flexion or extension or rotation increases her pain. neuro: Straight leg raise bilaterally is negative in seated and supine positions.  DTRs are 2+ bilateral symmetrical at knee.  ASSESSMENT / PLAN: #1.  Musculoskeletal low back pain: I have given her some exercises to do. 2.  Scapular area pain that I think is related also to her extended sitting or jump.  I have given her an exercise  regimen for that as well.  I would encourage her to get back into her yoga.  She can follow-up in 4 weeks if she is not improved or she can follow-up as needed.

## 2018-02-28 ENCOUNTER — Other Ambulatory Visit: Payer: Self-pay

## 2018-02-28 ENCOUNTER — Encounter: Payer: Self-pay | Admitting: Physician Assistant

## 2018-02-28 ENCOUNTER — Ambulatory Visit: Payer: BC Managed Care – PPO | Admitting: Physician Assistant

## 2018-02-28 VITALS — BP 102/64 | HR 101 | Temp 99.3°F | Resp 18 | Ht 64.0 in | Wt 144.6 lb

## 2018-02-28 DIAGNOSIS — M79675 Pain in left toe(s): Secondary | ICD-10-CM

## 2018-02-28 MED ORDER — DICLOFENAC SODIUM 1 % TD GEL: 2.0000 g | Freq: Four times a day (QID) | TRANSDERMAL | 0 refills | Status: DC

## 2018-02-28 MED ORDER — DICLOFENAC SODIUM 1 % TD GEL: 2.0000 g | Freq: Four times a day (QID) | TRANSDERMAL | Status: DC

## 2018-02-28 NOTE — Patient Instructions (Addendum)
Apply topical antiinflammatory gel to affected area 4 times a day and ice at least 4 times a day for 20 minutes at a time. Get some good orthotics for standing. Return to clinic if symptoms worsen, do not improve, or as needed. Otherwise, follow up in 2 weeks for reevaluation. Thank you for letting me participate in your health and well being.  Foot Pain Many things can cause foot pain. Some common causes are:  An injury.  A sprain.  Arthritis.  Blisters.  Bunions.  Follow these instructions at home: Pay attention to any changes in your symptoms. Take these actions to help with your discomfort:  If directed, put ice on the affected area: ? Put ice in a plastic bag. ? Place a towel between your skin and the bag. ? Leave the ice on for 15-20 minutes, 3?4 times a day for 2 days.  Take over-the-counter and prescription medicines only as told by your health care provider.  Wear comfortable, supportive shoes that fit you well. Do not wear high heels.  Do not stand or walk for long periods of time.  Do not lift a lot of weight. This can put added pressure on your feet.  Do stretches to relieve foot pain and stiffness as told by your health care provider.  Rub your foot gently.  Keep your feet clean and dry.  Contact a health care provider if:  Your pain does not get better after a few days of self-care.  Your pain gets worse.  You cannot stand on your foot. Get help right away if:  Your foot is numb or tingling.  Your foot or toes are swollen.  Your foot or toes turn Mangas or blue.  You have warmth and redness along your foot. This information is not intended to replace advice given to you by your health care provider. Make sure you discuss any questions you have with your health care provider. Document Released: 08/29/2015 Document Revised: 01/08/2016 Document Reviewed: 08/28/2014 Elsevier Interactive Patient Education  2018 [employer]   IF you received an  x-ray today, you will receive an invoice from [facility]. Please contact [facility] at [phone] with questions or concerns regarding your invoice.   IF you received labwork today, you will receive an invoice from [facility]. Please contact [facility] at [phone] with questions or concerns regarding your invoice.   Our billing staff will not be able to assist you with questions regarding bills from these companies.  You will be contacted with the lab results as soon as they are available. The fastest way to get your results is to activate your My Chart account. Instructions are located on the last page of this paperwork. If you have not heard from us regarding the results in 2 weeks, please contact this office.

## 2018-02-28 NOTE — Progress Notes (Signed)
Norma Gray  MRN: [id number] DOB: [birth year]  Subjective:  Norma Gray is a 32 y.o. female seen in office today for a chief complaint of intermittent left big toe pain x 1 week. Hurts on the bottom. Has some mild swelling. Aggravated by standing on feet all day. Relieved with rest. Denies acute injury. Denies overlying redness, warmth, numbness, tingling,  fever, and chills. Has PMH of ganglion cyst of hand. Denies arthralgias and myalgias.  No PMH of foot issues. No prior work up for this. Has not tried anything besides some intermittent rest. No other questions or concerns.    Review of Systems Per HPI  Patient Active Problem List   Diagnosis Date Noted  . Bipolar 2 disorder (HCC) 01/03/2018  . Need for prophylactic vaccination and inoculation against influenza 05/17/2017  . Sore throat 05/17/2017  . Acute pharyngitis 05/17/2017    Current Outpatient Medications on File Prior to Visit  Medication Sig Dispense Refill  . cyclobenzaprine (FLEXERIL) 5 MG tablet Take 1 tablet (5 mg total) by mouth 3 (three) times daily as needed for muscle spasms. 60 tablet 0  . FLUoxetine (PROZAC) 10 MG tablet Take 5 mg by mouth daily.     . fluticasone (FLONASE) 50 MCG/ACT nasal spray Place 2 sprays into both nostrils daily. 16 g 6  . lamoTRIgine (LAMICTAL) 150 MG tablet Take 300 mg by mouth daily.     . Levonorgestrel (SKYLA) 13.5 MG IUD by Intrauterine route.    . lithium carbonate 300 MG capsule Take 300 mg by mouth daily.     No current facility-administered medications on file prior to visit.     No Known Allergies   Objective:  BP 102/64   Pulse (!) 101   Temp 99.3 F (37.4 C) (Oral)   Resp 18   Ht 5\' 4"  (1.626 m)   Wt 144 lb 9.6 oz (65.6 kg)   LMP 02/21/2018   SpO2 99%   BMI 24.82 kg/m   Physical Exam  Constitutional: She is oriented to person, place, and time. She appears well-developed and well-nourished.  HENT:  Head: Normocephalic and atraumatic.  Eyes: Conjunctivae  are normal.  Neck: Normal range of motion.  Cardiovascular:  Pulses:      Dorsalis pedis pulses are 2+ on the right side, and 2+ on the left side.       Posterior tibial pulses are 2+ on the right side, and 2+ on the left side.  Pulmonary/Chest: Effort normal.  Musculoskeletal:       Left foot: There is normal range of motion, no bony tenderness, normal capillary refill and no crepitus.       Feet:  Neurological: She is alert and oriented to person, place, and time.  Skin: Skin is warm and dry.  Psychiatric: She has a normal mood and affect.  Vitals reviewed.   Assessment and Plan :  1. Pain of toe of left foot Unclear etiology at this time. DDx includes ganglion cyst vs tendonitis vs epidermoid cyst. No concerns for infectious etiology. Reassuring that pain is not constant. No numbness or tingling. Recommend topical NSAID gel as pt takes oral lithium. Apply ice to affected areas. Purchase good orthotics. Avoid aggravating factors. Follow up in 2 weeks, if no improvement at that time can consder imaging such as plain films or [place]. If sx worsen, return sooner.   Meds ordered this encounter  Medications  . DISCONTD: diclofenac sodium (VOLTAREN) 1 % transdermal gel 2 g  .  diclofenac sodium (VOLTAREN) 1 % GEL    Sig: Apply 2 g topically 4 (four) times daily.    Dispense:  100 g    Refill:  0    Order Specific Question:   Supervising Provider    Answer:   Nilda SimmerSMITH, KRISTI M [[id number]]     Benjiman CoreBrittany Lateasha Breuer PA-C  Primary Care at [facility]  [facility] 03/03/2018 6:02 PM

## 2018-03-03 ENCOUNTER — Encounter: Payer: Self-pay | Admitting: Physician Assistant

## 2018-03-15 NOTE — Progress Notes (Signed)
Norma Gray  MRN: [id number] DOB: 1986-01-05  Subjective:  Pt is a 32 y.o. female who presents for annual physical exam. She is fasting today.   Diet: Eats lots of vegetables and fruits, eats minimal meats, likes dairy. Drinks mostly water.  Exercise: Walking, swimming, and stretching. Doing yoga twice per week  Menses: LMP 02/21/18. Cycles are regular. Very light. Has IUD for contraception. She is sexually active with monogamous fiance.  Sleep:Gets about 7-8 hours. Bowel Movments:Has one every day, normal for her.   Last dental exam: 09/2017, brushes BID.  Last vision exam: 2018 Last pap smear: 2016, normal : Vaccinations      Tetanus: Thinks 2009 or 2010   Chronic Issues: 1)Depression: Followed by psychiatrist, Dr. Justin Mend, in [place]. Controlled on lithium, prozac, lamictal, and vit d. Has a list of labs requested by Dr. Justin Mend.  2) Muscle/back pain: Yoga is helping.          Patient Active Problem List   Diagnosis Date Noted  . Bipolar 2 disorder ([place]) 01/03/2018  . Need for prophylactic vaccination and inoculation against influenza 05/17/2017  . Sore throat 05/17/2017  . Acute pharyngitis 05/17/2017    Current Outpatient Medications on File Prior to Visit  Medication Sig Dispense Refill  . cyclobenzaprine (FLEXERIL) 5 MG tablet Take 1 tablet (5 mg total) by mouth 3 (three) times daily as needed for muscle spasms. 60 tablet 0  . diclofenac sodium (VOLTAREN) 1 % GEL Apply 2 g topically 4 (four) times daily. 100 g 0  . FLUoxetine (PROZAC) 10 MG tablet Take 5 mg by mouth daily.     . fluticasone (FLONASE) 50 MCG/ACT nasal spray Place 2 sprays into both nostrils daily. 16 g 6  . lamoTRIgine (LAMICTAL) 150 MG tablet Take 300 mg by mouth daily.     . Levonorgestrel (SKYLA) 13.5 MG IUD by Intrauterine route.    . lithium carbonate 300 MG capsule Take 300 mg by mouth daily.     No current facility-administered medications on file prior to visit.     No Known  Allergies  Social History   Socioeconomic History  . Marital status: Single    Spouse name: Not on file  . Number of children: 0  . Years of education: Not on file  . Highest education level: Not on file  Occupational History  . Not on file  Social Needs  . Financial resource strain: Not on file  . Food insecurity:    Worry: Not on file    Inability: Not on file  . Transportation needs:    Medical: Not on file    Non-medical: Not on file  Tobacco Use  . Smoking status: Never Smoker  . Smokeless tobacco: Never Used  Substance and Sexual Activity  . Alcohol use: Yes    Comment: 1-2 per week  . Drug use: No  . Sexual activity: Yes    Birth control/protection: Implant  Lifestyle  . Physical activity:    Days per week: 1 day    Minutes per session: 30 min  . Stress: Not on file  Relationships  . Social connections:    Talks on phone: Not on file    Gets together: Not on file    Attends religious service: Not on file    Active member of club or organization: Not on file    Attends meetings of clubs or organizations: Not on file    Relationship status: Not on file  Other Topics Concern  .  Not on file  Social History Narrative   Lives in [place]. From [place]. Works in Engineer, mining.     History reviewed. No pertinent surgical history.  Family History  Problem Relation Age of Onset  . Hyperlipidemia Mother   . Hyperlipidemia Father   . Hypertension Father   . Cancer Maternal Grandmother   . Cancer Paternal Grandmother   . Hyperlipidemia Paternal Grandmother   . Hypertension Paternal Grandmother   . Cancer Paternal Grandfather   . Hyperlipidemia Paternal Grandfather     Review of Systems  Constitutional: Negative for activity change, appetite change, chills, diaphoresis, fatigue, fever and unexpected weight change.  HENT: Negative for congestion, dental problem, drooling, ear discharge, ear pain, facial swelling, hearing loss, mouth sores, nosebleeds,  postnasal drip, rhinorrhea, sinus pressure, sinus pain, sneezing, sore throat, tinnitus, trouble swallowing and voice change.   Eyes: Negative for photophobia, pain, discharge, redness, itching and visual disturbance.  Respiratory: Negative for apnea, cough, choking, chest tightness, shortness of breath, wheezing and stridor.   Cardiovascular: Negative for chest pain, palpitations and leg swelling.  Gastrointestinal: Negative for abdominal distention, abdominal pain, anal bleeding, blood in stool, constipation, diarrhea, nausea, rectal pain and vomiting.  Endocrine: Negative for cold intolerance, heat intolerance, polydipsia, polyphagia and polyuria.  Genitourinary: Negative for decreased urine volume, difficulty urinating, dyspareunia, dysuria, enuresis, flank pain, frequency, genital sores, hematuria, menstrual problem, pelvic pain, urgency, vaginal bleeding, vaginal discharge and vaginal pain.  Musculoskeletal: Negative for arthralgias, back pain, gait problem, joint swelling, myalgias, neck pain and neck stiffness.  Skin: Negative for color change, pallor, rash and wound.  Allergic/Immunologic: Negative for environmental allergies, food allergies and immunocompromised state.  Neurological: Negative for dizziness, tremors, seizures, syncope, facial asymmetry, speech difficulty, weakness, light-headedness, numbness and headaches.  Hematological: Negative for adenopathy. Does not bruise/bleed easily.  Psychiatric/Behavioral: Negative for agitation, behavioral problems, confusion, decreased concentration, dysphoric mood, hallucinations, self-injury, sleep disturbance and suicidal ideas. The patient is not nervous/anxious and is not hyperactive.     Objective:  BP 108/68 (BP Location: Left Arm, Patient Position: Sitting, Cuff Size: Normal)   Pulse 100   Temp 98.9 F (37.2 C) (Oral)   Resp 20   Ht 5' 5.63" (1.667 m)   Wt 141 lb 3.2 oz (64 kg)   LMP 02/21/2018   SpO2 99%   BMI 23.05 kg/m    Physical Exam  Constitutional: She is oriented to person, place, and time. She appears well-developed and well-nourished. No distress.  HENT:  Head: Normocephalic and atraumatic.  Right Ear: Hearing, tympanic membrane, external ear and ear canal normal.  Left Ear: Hearing, tympanic membrane, external ear and ear canal normal.  Nose: Nose normal.  Mouth/Throat: Uvula is midline, oropharynx is clear and moist and mucous membranes are normal. No oropharyngeal exudate.  Eyes: Pupils are equal, round, and reactive to light. Conjunctivae, EOM and lids are normal. No scleral icterus.  Neck: Trachea normal and normal range of motion. No thyroid mass and no thyromegaly present.  Cardiovascular: Normal rate, regular rhythm, normal heart sounds and intact distal pulses.  Pulmonary/Chest: Effort normal and breath sounds normal.  Abdominal: Soft. Normal appearance and bowel sounds are normal. There is no tenderness.  Genitourinary: Vagina normal and uterus normal. Uterus is not deviated, not enlarged, not fixed and not tender. Cervix exhibits no motion tenderness, no discharge and no friability. Right adnexum displays no mass, no tenderness and no fullness. Left adnexum displays no mass, no tenderness and no fullness.  Genitourinary Comments: CMA  chaperone present for exam.   IUD strings visualized within cervical os.   Lymphadenopathy:       Head (right side): No tonsillar, no preauricular, no posterior auricular and no occipital adenopathy present.       Head (left side): No tonsillar, no preauricular, no posterior auricular and no occipital adenopathy present.    She has no cervical adenopathy.       Right: No supraclavicular adenopathy present.       Left: No supraclavicular adenopathy present.  Neurological: She is alert and oriented to person, place, and time. She has normal strength and normal reflexes.  Skin: Skin is warm and dry.    Visual Acuity Screening   Right eye Left eye Both eyes   Without correction: 20/20 20/20 20/20   With correction:       Assessment and Plan :  Discussed healthy lifestyle, diet, exercise, preventative care, vaccinations, and addressed patient's concerns. Plan for follow up in one year. Otherwise, plan for specific conditions below.  1. Annual physical exam Await lab results.   2. Depression, unspecified depression type 3. Long-term current use of lithium Once lab results return, will inform patient via mychart so she can take them to psychiatrist, Dr. Justin Mend, for review.  - CBC with Differential/Platelet - CMP14+EGFR - Lipid panel - Hemoglobin A1c - Vitamin B12 - VITAMIN D 25 Hydroxy (Vit-D Deficiency, Fractures) - TSH - T3, Free - T4, Free - Lithium level - Urinalysis, dipstick only  4. Screening for cervical cancer - Pap IG and HPV (high risk) DNA detection   Norma Delaine, PA-C  [facility] 03/16/2018 8:33 AM

## 2018-03-16 ENCOUNTER — Encounter: Payer: Self-pay | Admitting: Physician Assistant

## 2018-03-16 ENCOUNTER — Ambulatory Visit (INDEPENDENT_AMBULATORY_CARE_PROVIDER_SITE_OTHER): Payer: BC Managed Care – PPO | Admitting: Physician Assistant

## 2018-03-16 ENCOUNTER — Other Ambulatory Visit: Payer: Self-pay

## 2018-03-16 VITALS — BP 108/68 | HR 100 | Temp 98.9°F | Resp 20 | Ht 65.63 in | Wt 141.2 lb

## 2018-03-16 DIAGNOSIS — Z131 Encounter for screening for diabetes mellitus: Secondary | ICD-10-CM

## 2018-03-16 DIAGNOSIS — Z124 Encounter for screening for malignant neoplasm of cervix: Secondary | ICD-10-CM

## 2018-03-16 DIAGNOSIS — Z0000 Encounter for general adult medical examination without abnormal findings: Secondary | ICD-10-CM | POA: Diagnosis not present

## 2018-03-16 DIAGNOSIS — Z23 Encounter for immunization: Secondary | ICD-10-CM

## 2018-03-16 DIAGNOSIS — F329 Major depressive disorder, single episode, unspecified: Secondary | ICD-10-CM

## 2018-03-16 DIAGNOSIS — F32A Depression, unspecified: Secondary | ICD-10-CM

## 2018-03-16 DIAGNOSIS — Z79899 Other long term (current) drug therapy: Secondary | ICD-10-CM

## 2018-03-16 NOTE — Patient Instructions (Addendum)
Health Maintenance, Female Adopting a healthy lifestyle and getting preventive care can go a long way to promote health and wellness. Talk with your health care provider about what schedule of regular examinations is right for you. This is a good chance for you to check in with your provider about disease prevention and staying healthy. In between checkups, there are plenty of things you can do on your own. Experts have done a lot of research about which lifestyle changes and preventive measures are most likely to keep you healthy. Ask your health care provider for more information. Weight and diet Eat a healthy diet  Be sure to include plenty of vegetables, fruits, low-fat dairy products, and lean protein.  Do not eat a lot of foods high in solid fats, added sugars, or salt.  Get regular exercise. This is one of the most important things you can do for your health. ? Most adults should exercise for at least 150 minutes each week. The exercise should increase your heart rate and make you sweat (moderate-intensity exercise). ? Most adults should also do strengthening exercises at least twice a week. This is in addition to the moderate-intensity exercise.  Maintain a healthy weight  Body mass index (BMI) is a measurement that can be used to identify possible weight problems. It estimates body fat based on height and weight. Your health care provider can help determine your BMI and help you achieve or maintain a healthy weight.  For females 69 years of age and older: ? A BMI below 18.5 is considered underweight. ? A BMI of 18.5 to 24.9 is normal. ? A BMI of 25 to 29.9 is considered overweight. ? A BMI of 30 and above is considered obese.  Watch levels of cholesterol and blood lipids  You should start having your blood tested for lipids and cholesterol at 31 years of age, then have this test every 5 years.  You may need to have your cholesterol levels checked more often if: ? Your lipid or  cholesterol levels are high. ? You are older than 31 years of age. ? You are at high risk for heart disease.  Cancer screening Lung Cancer  Lung cancer screening is recommended for adults 70-27 years old who are at high risk for lung cancer because of a history of smoking.  A yearly low-dose CT scan of the lungs is recommended for people who: ? Currently smoke. ? Have quit within the past 15 years. ? Have at least a 30-pack-year history of smoking. A pack year is smoking an average of one pack of cigarettes a day for 1 year.  Yearly screening should continue until it has been 15 years since you quit.  Yearly screening should stop if you develop a health problem that would prevent you from having lung cancer treatment.  Breast Cancer  Practice breast self-awareness. This means understanding how your breasts normally appear and feel.  It also means doing regular breast self-exams. Let your health care provider know about any changes, no matter how small.  If you are in your 20s or 30s, you should have a clinical breast exam (CBE) by a health care provider every 1-3 years as part of a regular health exam.  If you are 68 or older, have a CBE every year. Also consider having a breast X-ray (mammogram) every year.  If you have a family history of breast cancer, talk to your health care provider about genetic screening.  If you are at high risk  for breast cancer, talk to your health care provider about having an MRI and a mammogram every year.  Breast cancer gene (BRCA) assessment is recommended for women who have family members with BRCA-related cancers. BRCA-related cancers include: ? Breast. ? Ovarian. ? Tubal. ? Peritoneal cancers.  Results of the assessment will determine the need for genetic counseling and BRCA1 and BRCA2 testing.  Cervical Cancer Your health care provider may recommend that you be screened regularly for cancer of the pelvic organs (ovaries, uterus, and  vagina). This screening involves a pelvic examination, including checking for microscopic changes to the surface of your cervix (Pap test). You may be encouraged to have this screening done every 3 years, beginning at age 22.  For women ages 56-65, health care providers may recommend pelvic exams and Pap testing every 3 years, or they may recommend the Pap and pelvic exam, combined with testing for human papilloma virus (HPV), every 5 years. Some types of HPV increase your risk of cervical cancer. Testing for HPV may also be done on women of any age with unclear Pap test results.  Other health care providers may not recommend any screening for nonpregnant women who are considered low risk for pelvic cancer and who do not have symptoms. Ask your health care provider if a screening pelvic exam is right for you.  If you have had past treatment for cervical cancer or a condition that could lead to cancer, you need Pap tests and screening for cancer for at least 20 years after your treatment. If Pap tests have been discontinued, your risk factors (such as having a new sexual partner) need to be reassessed to determine if screening should resume. Some women have medical problems that increase the chance of getting cervical cancer. In these cases, your health care provider may recommend more frequent screening and Pap tests.  Colorectal Cancer  This type of cancer can be detected and often prevented.  Routine colorectal cancer screening usually begins at 31 years of age and continues through 31 years of age.  Your health care provider may recommend screening at an earlier age if you have risk factors for colon cancer.  Your health care provider may also recommend using home test kits to check for hidden blood in the stool.  A small camera at the end of a tube can be used to examine your colon directly (sigmoidoscopy or colonoscopy). This is done to check for the earliest forms of colorectal  cancer.  Routine screening usually begins at age 33.  Direct examination of the colon should be repeated every 5-10 years through 31 years of age. However, you may need to be screened more often if early forms of precancerous polyps or small growths are found.  Skin Cancer  Check your skin from head to toe regularly.  Tell your health care provider about any new moles or changes in moles, especially if there is a change in a mole's shape or color.  Also tell your health care provider if you have a mole that is larger than the size of a pencil eraser.  Always use sunscreen. Apply sunscreen liberally and repeatedly throughout the day.  Protect yourself by wearing long sleeves, pants, a wide-brimmed hat, and sunglasses whenever you are outside.  Heart disease, diabetes, and high blood pressure  High blood pressure causes heart disease and increases the risk of stroke. High blood pressure is more likely to develop in: ? People who have blood pressure in the high end of  the normal range (130-139/85-89 mm Hg). ? People who are overweight or obese. ? People who are African American.  If you are 21-29 years of age, have your blood pressure checked every 3-5 years. If you are 3 years of age or older, have your blood pressure checked every year. You should have your blood pressure measured twice-once when you are at a hospital or clinic, and once when you are not at a hospital or clinic. Record the average of the two measurements. To check your blood pressure when you are not at a hospital or clinic, you can use: ? An automated blood pressure machine at a pharmacy. ? A home blood pressure monitor.  If you are between 17 years and 37 years old, ask your health care provider if you should take aspirin to prevent strokes.  Have regular diabetes screenings. This involves taking a blood sample to check your fasting blood sugar level. ? If you are at a normal weight and have a low risk for diabetes,  have this test once every three years after 31 years of age. ? If you are overweight and have a high risk for diabetes, consider being tested at a younger age or more often. Preventing infection Hepatitis B  If you have a higher risk for hepatitis B, you should be screened for this virus. You are considered at high risk for hepatitis B if: ? You were born in a country where hepatitis B is common. Ask your health care provider which countries are considered high risk. ? Your parents were born in a high-risk country, and you have not been immunized against hepatitis B (hepatitis B vaccine). ? You have HIV or AIDS. ? You use needles to inject street drugs. ? You live with someone who has hepatitis B. ? You have had sex with someone who has hepatitis B. ? You get hemodialysis treatment. ? You take certain medicines for conditions, including cancer, organ transplantation, and autoimmune conditions.  Hepatitis C  Blood testing is recommended for: ? Everyone born from [birth year] through 1964. ? Anyone with known risk factors for hepatitis C.  Sexually transmitted infections (STIs)  You should be screened for sexually transmitted infections (STIs) including gonorrhea and chlamydia if: ? You are sexually active and are younger than 31 years of age. ? You are older than 31 years of age and your health care provider tells you that you are at risk for this type of infection. ? Your sexual activity has changed since you were last screened and you are at an increased risk for chlamydia or gonorrhea. Ask your health care provider if you are at risk.  If you do not have HIV, but are at risk, it may be recommended that you take a prescription medicine daily to prevent HIV infection. This is called pre-exposure prophylaxis (PrEP). You are considered at risk if: ? You are sexually active and do not regularly use condoms or know the HIV status of your partner(s). ? You take drugs by injection. ? You are  sexually active with a partner who has HIV.  Talk with your health care provider about whether you are at high risk of being infected with HIV. If you choose to begin PrEP, you should first be tested for HIV. You should then be tested every 3 months for as long as you are taking PrEP. Pregnancy  If you are premenopausal and you may become pregnant, ask your health care provider about preconception counseling.  If you may become  pregnant, take 400 to 800 micrograms (mcg) of folic acid every day.  If you want to prevent pregnancy, talk to your health care provider about birth control (contraception). Osteoporosis and menopause  Osteoporosis is a disease in which the bones lose minerals and strength with aging. This can result in serious bone fractures. Your risk for osteoporosis can be identified using a bone density scan.  If you are 15 years of age or older, or if you are at risk for osteoporosis and fractures, ask your health care provider if you should be screened.  Ask your health care provider whether you should take a calcium or vitamin D supplement to lower your risk for osteoporosis.  Menopause may have certain physical symptoms and risks.  Hormone replacement therapy may reduce some of these symptoms and risks. Talk to your health care provider about whether hormone replacement therapy is right for you. Follow these instructions at home:  Schedule regular health, dental, and eye exams.  Stay current with your immunizations.  Do not use any tobacco products including cigarettes, chewing tobacco, or electronic cigarettes.  If you are pregnant, do not drink alcohol.  If you are breastfeeding, limit how much and how often you drink alcohol.  Limit alcohol intake to no more than 1 drink per day for nonpregnant women. One drink equals 12 ounces of beer, 5 ounces of wine, or 1 ounces of hard liquor.  Do not use street drugs.  Do not share needles.  Ask your health care  provider for help if you need support or information about quitting drugs.  Tell your health care provider if you often feel depressed.  Tell your health care provider if you have ever been abused or do not feel safe at home. This information is not intended to replace advice given to you by your health care provider. Make sure you discuss any questions you have with your health care provider. Document Released: 02/15/2011 Document Revised: 01/08/2016 Document Reviewed: 05/06/2015 Elsevier Interactive Patient Education  2018 [employer].    IF you received an x-ray today, you will receive an invoice from [facility]. Please contact [facility] at [phone] with questions or concerns regarding your invoice.   IF you received labwork today, you will receive an invoice from [facility]. Please contact [facility] at [phone] with questions or concerns regarding your invoice.   Our billing staff will not be able to assist you with questions regarding bills from these companies.  You will be contacted with the lab results as soon as they are available. The fastest way to get your results is to activate your My Chart account. Instructions are located on the last page of this paperwork. If you have not heard from [place] regarding the results in 2 weeks, please contact this office.

## 2018-03-17 LAB — CMP14+EGFR
ALT: 19 IU/L (ref 0–32)
AST: 16 IU/L (ref 0–40)
Albumin/Globulin Ratio: 2.1 (ref 1.2–2.2)
Albumin: 4.9 g/dL (ref 3.5–5.5)
Alkaline Phosphatase: 45 IU/L (ref 39–117)
BUN/Creatinine Ratio: 14 (ref 9–23)
BUN: 12 mg/dL (ref 6–20)
Bilirubin Total: 1.1 mg/dL (ref 0.0–1.2)
CALCIUM: 9.4 mg/dL (ref 8.7–10.2)
CO2: 19 mmol/L — AB (ref 20–29)
CREATININE: 0.85 mg/dL (ref 0.57–1.00)
Chloride: 103 mmol/L (ref 96–106)
GFR calc Af Amer: 106 mL/min/{1.73_m2} (ref >59)
GFR, EST NON AFRICAN AMERICAN: 92 mL/min/{1.73_m2} (ref >59)
GLOBULIN, TOTAL: 2.3 g/dL (ref 1.5–4.5)
GLUCOSE: 100 mg/dL — AB (ref 65–99)
Potassium: 4.4 mmol/L (ref 3.5–5.2)
SODIUM: 139 mmol/L (ref 134–144)
Total Protein: 7.2 g/dL (ref 6.0–8.5)

## 2018-03-17 LAB — LIPID PANEL
CHOL/HDL RATIO: 5.4 ratio — AB (ref 0.0–4.4)
CHOLESTEROL TOTAL: 212 mg/dL — AB (ref 100–199)
HDL: 39 mg/dL — AB (ref >39)
LDL CALC: 155 mg/dL — AB (ref 0–99)
TRIGLYCERIDES: 92 mg/dL (ref 0–149)
VLDL CHOLESTEROL CAL: 18 mg/dL (ref 5–40)

## 2018-03-17 LAB — T3, FREE: T3 FREE: 3.1 pg/mL (ref 2.0–4.4)

## 2018-03-17 LAB — CBC WITH DIFFERENTIAL/PLATELET
BASOS ABS: 0 10*3/uL (ref 0.0–0.2)
Basos: 1 %
EOS (ABSOLUTE): 0.1 10*3/uL (ref 0.0–0.4)
Eos: 1 %
HEMATOCRIT: 36.2 % (ref 34.0–46.6)
HEMOGLOBIN: 12.7 g/dL (ref 11.1–15.9)
IMMATURE GRANULOCYTES: 0 %
Immature Grans (Abs): 0 10*3/uL (ref 0.0–0.1)
Lymphocytes Absolute: 1.4 10*3/uL (ref 0.7–3.1)
Lymphs: 36 %
MCH: 31.8 pg (ref 26.6–33.0)
MCHC: 35.1 g/dL (ref 31.5–35.7)
MCV: 91 fL (ref 79–97)
MONOCYTES: 7 %
MONOS ABS: 0.3 10*3/uL (ref 0.1–0.9)
NEUTROS PCT: 55 %
Neutrophils Absolute: 2.1 10*3/uL (ref 1.4–7.0)
Platelets: 198 10*3/uL (ref 150–450)
RBC: 4 x10E6/uL (ref 3.77–5.28)
RDW: 13.1 % (ref 12.3–15.4)
WBC: 3.9 10*3/uL (ref 3.4–10.8)

## 2018-03-17 LAB — VITAMIN D 25 HYDROXY (VIT D DEFICIENCY, FRACTURES): Vit D, 25-Hydroxy: 29.2 ng/mL — ABNORMAL LOW (ref 30.0–100.0)

## 2018-03-17 LAB — TSH: TSH: 3 u[IU]/mL (ref 0.450–4.500)

## 2018-03-17 LAB — HEMOGLOBIN A1C
Est. average glucose Bld gHb Est-mCnc: 85 mg/dL
Hgb A1c MFr Bld: 4.6 % — ABNORMAL LOW (ref 4.8–5.6)

## 2018-03-17 LAB — URINALYSIS, DIPSTICK ONLY
Bilirubin, UA: NEGATIVE
GLUCOSE, UA: NEGATIVE
KETONES UA: NEGATIVE
LEUKOCYTES UA: NEGATIVE
Nitrite, UA: NEGATIVE
Protein, UA: NEGATIVE
RBC, UA: NEGATIVE
Specific Gravity, UA: 1.016 (ref 1.005–1.030)
Urobilinogen, Ur: 0.2 mg/dL (ref 0.2–1.0)
pH, UA: 7 (ref 5.0–7.5)

## 2018-03-17 LAB — LITHIUM LEVEL

## 2018-03-17 LAB — VITAMIN B12: VITAMIN B 12: 631 pg/mL (ref 232–1245)

## 2018-03-17 LAB — T4, FREE: FREE T4: 1.35 ng/dL (ref 0.82–1.77)

## 2018-03-20 LAB — PAP IG AND HPV HIGH-RISK
HPV, HIGH-RISK: NEGATIVE
PAP SMEAR COMMENT: 0

## 2018-03-31 ENCOUNTER — Telehealth: Payer: Self-pay | Admitting: Physician Assistant

## 2018-03-31 NOTE — Telephone Encounter (Signed)
Medication was denied. Denial letter is in the providers box.

## 2018-03-31 NOTE — Telephone Encounter (Signed)
See message below °

## 2018-04-14 NOTE — Telephone Encounter (Signed)
Pt called and states she ended up paying out of pocket for the Voltaren gel.

## 2018-06-23 IMAGING — DX DG SHOULDER 2+V*L*
3 series · 3 of 3 positions shown · non-contrast
Comparison: None in PACs

CLINICAL DATA: Four years of left shoulder pain

EXAM:
LEFT SHOULDER - 2+ VIEW

[shoulder ap]
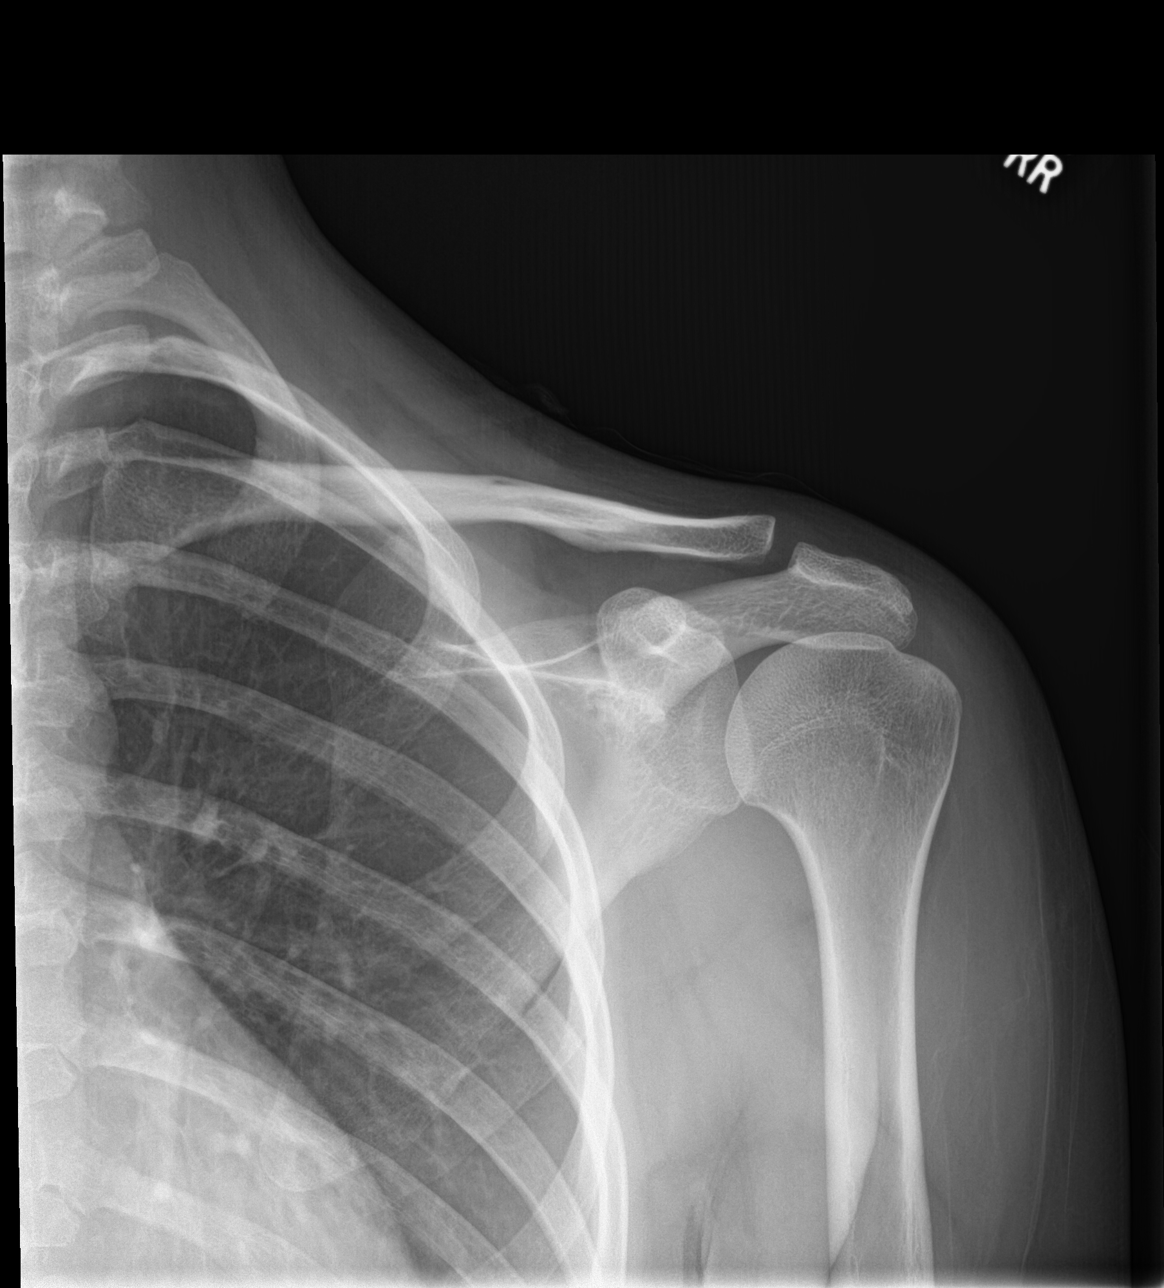

[shoulder y-view]
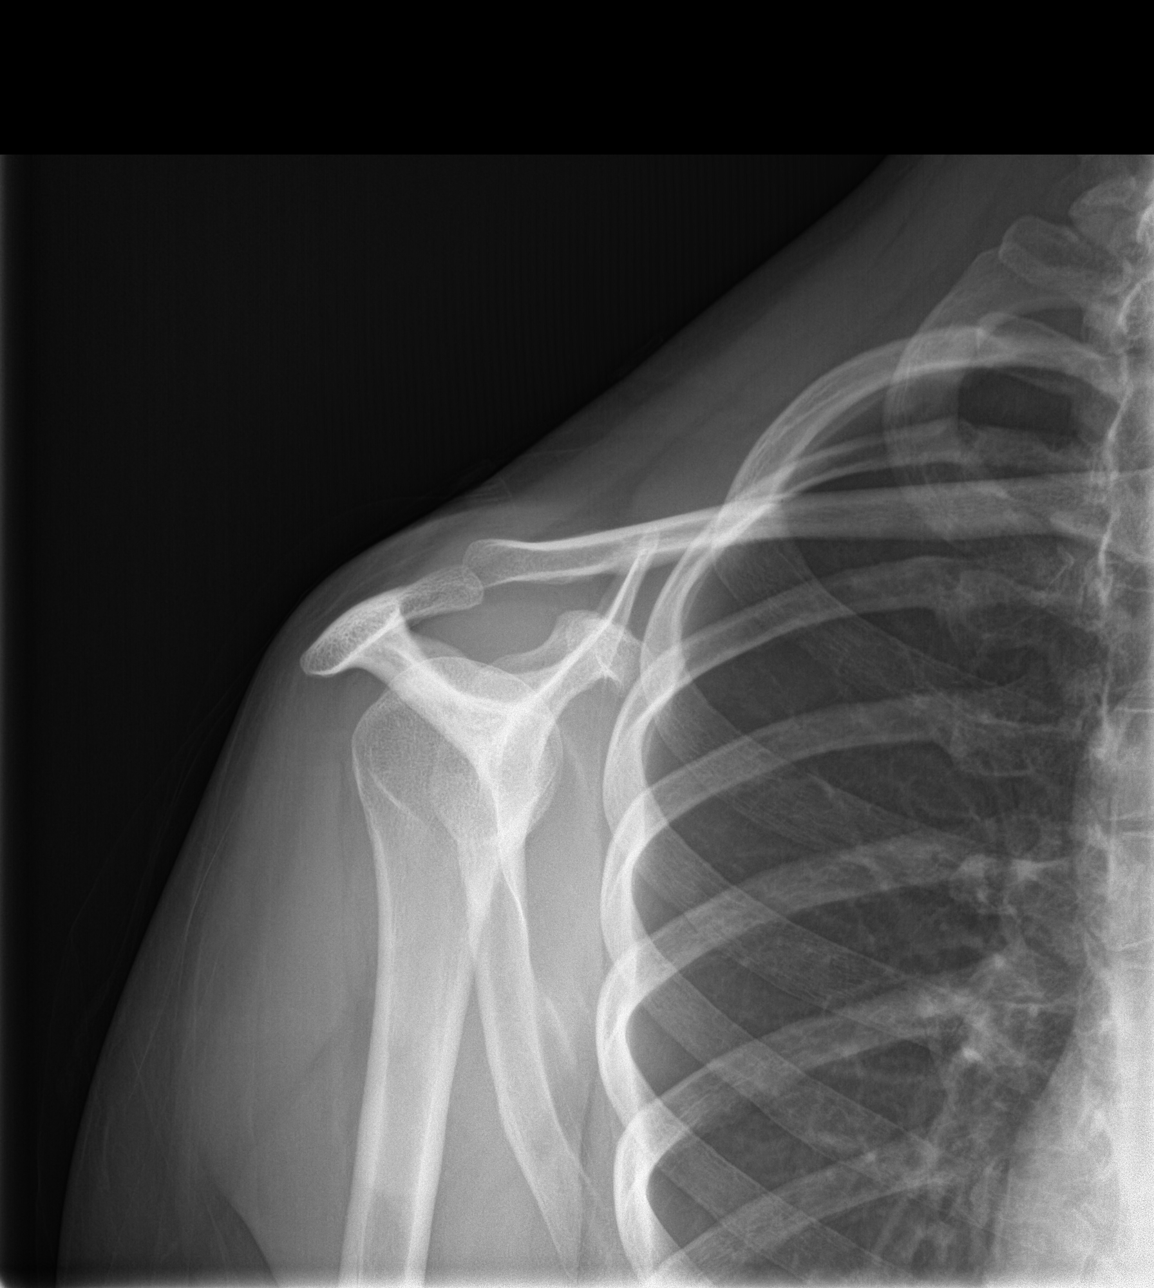

[shoulder axial]
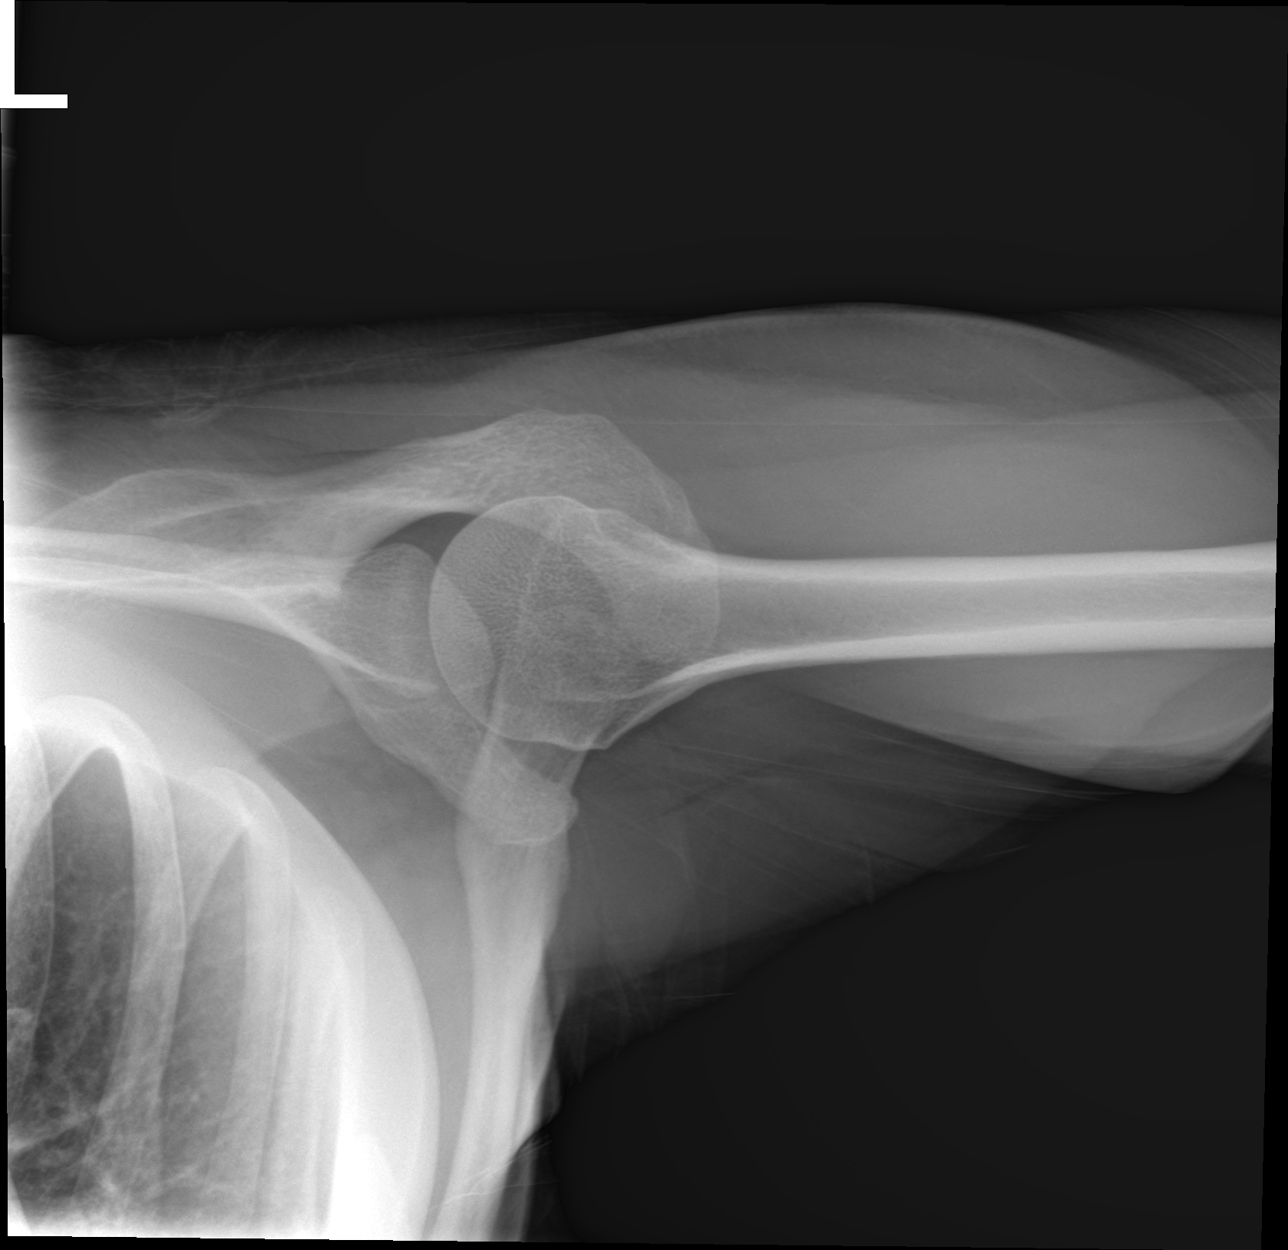

[3 of 3 positions shown; findings below may reference images not displayed]

FINDINGS: The bones are subjectively adequately mineralized. The joint spaces
are well maintained. There is no significant osteophyte formation.
The subacromial subdeltoid space is normal. The soft tissues are
unremarkable.
IMPRESSION: There is no acute or chronic bony abnormality of the left shoulder.

## 2018-10-19 ENCOUNTER — Encounter: Payer: Self-pay | Admitting: Emergency Medicine

## 2018-10-19 ENCOUNTER — Telehealth: Payer: Self-pay | Admitting: *Deleted

## 2018-10-19 ENCOUNTER — Other Ambulatory Visit: Payer: Self-pay

## 2018-10-19 ENCOUNTER — Ambulatory Visit: Payer: BC Managed Care – PPO | Admitting: Emergency Medicine

## 2018-10-19 VITALS — BP 98/64 | HR 101 | Temp 98.8°F | Resp 18 | Ht 65.63 in | Wt 151.6 lb

## 2018-10-19 DIAGNOSIS — J3489 Other specified disorders of nose and nasal sinuses: Secondary | ICD-10-CM | POA: Diagnosis not present

## 2018-10-19 DIAGNOSIS — R0981 Nasal congestion: Secondary | ICD-10-CM

## 2018-10-19 DIAGNOSIS — J0100 Acute maxillary sinusitis, unspecified: Secondary | ICD-10-CM | POA: Diagnosis not present

## 2018-10-19 MED ORDER — TRIAMCINOLONE ACETONIDE 55 MCG/ACT NA AERO: 2.0000 | INHALATION_SPRAY | Freq: Every day | NASAL | 12 refills | Status: DC

## 2018-10-19 MED ORDER — AZITHROMYCIN 250 MG PO TABS: ORAL_TABLET | ORAL | 0 refills | Status: DC

## 2018-10-19 NOTE — Patient Instructions (Addendum)
   If you have lab work done today you will be contacted with your lab results within the next 2 weeks.  If you have not heard from us then please contact us. The fastest way to get your results is to register for My Chart.   IF you received an x-ray today, you will receive an invoice from [facility]. Please contact [facility] at [phone] with questions or concerns regarding your invoice.   IF you received labwork today, you will receive an invoice from [facility]. Please contact [facility] at [phone] with questions or concerns regarding your invoice.   Our billing staff will not be able to assist you with questions regarding bills from these companies.  You will be contacted with the lab results as soon as they are available. The fastest way to get your results is to activate your My Chart account. Instructions are located on the last page of this paperwork. If you have not heard from us regarding the results in 2 weeks, please contact this office.     Sinusitis, Adult Sinusitis is soreness and swelling (inflammation) of your sinuses. Sinuses are hollow spaces in the bones around your face. They are located:  Around your eyes.  In the middle of your forehead.  Behind your nose.  In your cheekbones. Your sinuses and nasal passages are lined with a fluid called mucus. Mucus drains out of your sinuses. Swelling can trap mucus in your sinuses. This lets germs (bacteria, virus, or fungus) grow, which leads to infection. Most of the time, this condition is caused by a virus. What are the causes? This condition is caused by:  Allergies.  Asthma.  Germs.  Things that block your nose or sinuses.  Growths in the nose (nasal polyps).  Chemicals or irritants in the air.  Fungus (rare). What increases the risk? You are more likely to develop this condition if:  You have a weak body defense system (immune system).  You do a lot of swimming or  diving.  You use nasal sprays too much.  You smoke. What are the signs or symptoms? The main symptoms of this condition are pain and a feeling of pressure around the sinuses. Other symptoms include:  Stuffy nose (congestion).  Runny nose (drainage).  Swelling and warmth in the sinuses.  Headache.  Toothache.  A cough that may get worse at night.  Mucus that collects in the throat or the back of the nose (postnasal drip).  Being unable to smell and taste.  Being very tired (fatigue).  A fever.  Sore throat.  Bad breath. How is this diagnosed? This condition is diagnosed based on:  Your symptoms.  Your medical history.  A physical exam.  Tests to find out if your condition is short-term (acute) or long-term (chronic). Your doctor may: ? Check your nose for growths (polyps). ? Check your sinuses using a tool that has a light (endoscope). ? Check for allergies or germs. ? Do imaging tests, such as an MRI or CT scan. How is this treated? Treatment for this condition depends on the cause and whether it is short-term or long-term.  If caused by a virus, your symptoms should go away on their own within 10 days. You may be given medicines to relieve symptoms. They include: ? Medicines that shrink swollen tissue in the nose. ? Medicines that treat allergies (antihistamines). ? A spray that treats swelling of the nostrils. ? Rinses that help get rid of thick mucus in your   nose (nasal saline washes).  If caused by bacteria, your doctor may wait to see if you will get better without treatment. You may be given antibiotic medicine if you have: ? A very bad infection. ? A weak body defense system.  If caused by growths in the nose, you may need to have surgery. Follow these instructions at home: Medicines  Take, use, or apply over-the-counter and prescription medicines only as told by your doctor. These may include nasal sprays.  If you were prescribed an antibiotic  medicine, take it as told by your doctor. Do not stop taking the antibiotic even if you start to feel better. Hydrate and humidify   Drink enough water to keep your pee (urine) pale yellow.  Use a cool mist humidifier to keep the humidity level in your home above 50%.  Breathe in steam for 10-15 minutes, 3-4 times a day, or as told by your doctor. You can do this in the bathroom while a hot shower is running.  Try not to spend time in cool or dry air. Rest  Rest as much as you can.  Sleep with your head raised (elevated).  Make sure you get enough sleep each night. General instructions   Put a warm, moist washcloth on your face 3-4 times a day, or as often as told by your doctor. This will help with discomfort.  Wash your hands often with soap and water. If there is no soap and water, use hand sanitizer.  Do not smoke. Avoid being around people who are smoking (secondhand smoke).  Keep all follow-up visits as told by your doctor. This is important. Contact a doctor if:  You have a fever.  Your symptoms get worse.  Your symptoms do not get better within 10 days. Get help right away if:  You have a very bad headache.  You cannot stop throwing up (vomiting).  You have very bad pain or swelling around your face or eyes.  You have trouble seeing.  You feel confused.  Your neck is stiff.  You have trouble breathing. Summary  Sinusitis is swelling of your sinuses. Sinuses are hollow spaces in the bones around your face.  This condition is caused by tissues in your nose that become inflamed or swollen. This traps germs. These can lead to infection.  If you were prescribed an antibiotic medicine, take it as told by your doctor. Do not stop taking it even if you start to feel better.  Keep all follow-up visits as told by your doctor. This is important. This information is not intended to replace advice given to you by your health care provider. Make sure you discuss  any questions you have with your health care provider. Document Released: 01/19/2008 Document Revised: 01/02/2018 Document Reviewed: 01/02/2018 Elsevier Interactive Patient Education  2019 Elsevier Inc.  

## 2018-10-19 NOTE — Progress Notes (Signed)
Norma RegulusKelsey A Schnieders 32 y.o.   Chief Complaint  Patient presents with  . Fatigue    x4days no fever   . Sinus Problem    pressure and congestion  . Cough    little cough that comes and goes    HISTORY OF PRESENT ILLNESS: This is a 33 y.o. female complaining of sinus pressure and congestion with fatigue and cough that started 4 days ago.  No other significant symptoms.  Sinus Problem  This is a new problem. The current episode started in the past 7 days. The problem has been gradually worsening since onset. There has been no fever. Her pain is at a severity of 5/10. The pain is moderate. Associated symptoms include congestion, coughing and sinus pressure. Pertinent negatives include no chills, headaches, neck pain, shortness of breath or swollen glands. Treatments tried: Antihistamine. The treatment provided mild relief.     Prior to Admission medications   Medication Sig Start Date End Date Taking? Authorizing Provider  FLUoxetine (PROZAC) 10 MG tablet Take 5 mg by mouth daily.    Yes [provider]  lamoTRIgine (LAMICTAL) 150 MG tablet Take 300 mg by mouth daily.    Yes [provider]  Levonorgestrel (SKYLA) 13.5 MG IUD by Intrauterine route.   Yes [provider]  azithromycin (ZITHROMAX) 250 MG tablet Sig as indicated 10/19/18   Georgina QuintSagardia, Gunther Zawadzki Jose, MD  cyclobenzaprine (FLEXERIL) 5 MG tablet Take 1 tablet (5 mg total) by mouth 3 (three) times daily as needed for muscle spasms. Patient not taking: Reported on 10/19/2018 01/17/18   Benjiman CoreWiseman, Brittany D, PA-C  diclofenac sodium (VOLTAREN) 1 % GEL Apply 2 g topically 4 (four) times daily. Patient not taking: Reported on 10/19/2018 02/28/18   Benjiman CoreWiseman, Brittany D, PA-C  lithium carbonate 300 MG capsule Take 300 mg by mouth daily.    [provider]  triamcinolone (NASACORT) 55 MCG/ACT AERO nasal inhaler Place 2 sprays into the nose daily. 10/19/18   Georgina QuintSagardia, Gyneth Hubka Jose, MD    No Known Allergies  Patient  Active Problem List   Diagnosis Date Noted  . Bipolar 2 disorder (HCC) 01/03/2018  . Need for prophylactic vaccination and inoculation against influenza 05/17/2017  . Sore throat 05/17/2017  . Acute pharyngitis 05/17/2017    Past Medical History:  Diagnosis Date  . Allergy   . Depression     History reviewed. No pertinent surgical history.  Social History   Socioeconomic History  . Marital status: Single    Spouse name: Not on file  . Number of children: 0  . Years of education: Not on file  . Highest education level: Not on file  Occupational History  . Not on file  Social Needs  . Financial resource strain: Not on file  . Food insecurity:    Worry: Not on file    Inability: Not on file  . Transportation needs:    Medical: Not on file    Non-medical: Not on file  Tobacco Use  . Smoking status: Never Smoker  . Smokeless tobacco: Never Used  Substance and Sexual Activity  . Alcohol use: Yes    Comment: 1-2 per week  . Drug use: No  . Sexual activity: Yes    Birth control/protection: Implant  Lifestyle  . Physical activity:    Days per week: 1 day    Minutes per session: 30 min  . Stress: Not on file  Relationships  . Social connections:    Talks on phone: Not  on file    Gets together: Not on file    Attends religious service: Not on file    Active member of club or organization: Not on file    Attends meetings of clubs or organizations: Not on file    Relationship status: Not on file  . Intimate partner violence:    Fear of current or ex partner: Not on file    Emotionally abused: Not on file    Physically abused: Not on file    Forced sexual activity: Not on file  Other Topics Concern  . Not on file  Social History Narrative   Lives in [place]. From [place]. Works in Nurse, mental health.     Family History  Problem Relation Age of Onset  . Hyperlipidemia Mother   . Hyperlipidemia Father   . Hypertension Father   . Cancer Maternal Grandmother    . Cancer Paternal Grandmother   . Hyperlipidemia Paternal Grandmother   . Hypertension Paternal Grandmother   . Cancer Paternal Grandfather   . Hyperlipidemia Paternal Grandfather      Review of Systems  Constitutional: Positive for fever. Negative for chills.  HENT: Positive for congestion and sinus pressure.   Eyes: Negative.   Respiratory: Positive for cough. Negative for sputum production and shortness of breath.   Cardiovascular: Negative.  Negative for chest pain and palpitations.  Gastrointestinal: Negative.  Negative for abdominal pain, diarrhea, nausea and vomiting.  Genitourinary: Negative.   Musculoskeletal: Negative.  Negative for neck pain.  Skin: Negative.   Neurological: Negative.  Negative for dizziness and headaches.  Endo/Heme/Allergies: Negative.   All other systems reviewed and are negative.  Vitals:   10/19/18 1025  BP: 98/64  Pulse: (!) 101  Resp: 18  Temp: 98.8 F (37.1 C)  SpO2: 98%     Physical Exam Vitals signs reviewed.  Constitutional:      Appearance: Normal appearance.  HENT:     Head: Normocephalic and atraumatic.     Right Ear: Tympanic membrane, ear canal and external ear normal.     Left Ear: Tympanic membrane, ear canal and external ear normal.     Nose: Congestion present.     Mouth/Throat:     Mouth: Mucous membranes are moist.     Pharynx: Oropharynx is clear.  Eyes:     Extraocular Movements: Extraocular movements intact.     Conjunctiva/sclera: Conjunctivae normal.     Pupils: Pupils are equal, round, and reactive to light.  Neck:     Musculoskeletal: Normal range of motion and neck supple. No muscular tenderness.  Cardiovascular:     Rate and Rhythm: Normal rate and regular rhythm.     Heart sounds: Normal heart sounds.  Pulmonary:     Effort: Pulmonary effort is normal.     Breath sounds: Normal breath sounds.  Musculoskeletal: Normal range of motion.  Lymphadenopathy:     Cervical: No cervical adenopathy.   Skin:    General: Skin is warm and dry.     Capillary Refill: Capillary refill takes less than 2 seconds.  Neurological:     General: No focal deficit present.     Mental Status: She is alert and oriented to person, place, and time.  Psychiatric:        Mood and Affect: Mood normal.        Behavior: Behavior normal.      ASSESSMENT & PLAN: Keri was seen today for fatigue, sinus problem and cough.  Diagnoses and all orders  for this visit:  Acute non-recurrent maxillary sinusitis  Sinus pressure  Sinus congestion  Other orders -     triamcinolone (NASACORT) 55 MCG/ACT AERO nasal inhaler; Place 2 sprays into the nose daily. -     azithromycin (ZITHROMAX) 250 MG tablet; Sig as indicated    Patient Instructions       If you have lab work done today you will be contacted with your lab results within the next 2 weeks.  If you have not heard from [place] then please contact us. The fastest way to get your results is to register for My Chart.   IF you received an x-ray today, you will receive an invoice from [facility]. Please contact [facility] at [phone] with questions or concerns regarding your invoice.   IF you received labwork today, you will receive an invoice from [facility]. Please contact [facility] at [phone] with questions or concerns regarding your invoice.   Our billing staff will not be able to assist you with questions regarding bills from these companies.  You will be contacted with the lab results as soon as they are available. The fastest way to get your results is to activate your My Chart account. Instructions are located on the last page of this paperwork. If you have not heard from [place] regarding the results in 2 weeks, please contact this office.     Sinusitis, Adult Sinusitis is soreness and swelling (inflammation) of your sinuses. Sinuses are hollow spaces in the bones around your face. They are located:  Around your  eyes.  In the middle of your forehead.  Behind your nose.  In your cheekbones. Your sinuses and nasal passages are lined with a fluid called mucus. Mucus drains out of your sinuses. Swelling can trap mucus in your sinuses. This lets germs (bacteria, virus, or fungus) grow, which leads to infection. Most of the time, this condition is caused by a virus. What are the causes? This condition is caused by:  Allergies.  Asthma.  Germs.  Things that block your nose or sinuses.  Growths in the nose (nasal polyps).  Chemicals or irritants in the air.  Fungus (rare). What increases the risk? You are more likely to develop this condition if:  You have a weak body defense system (immune system).  You do a lot of swimming or diving.  You use nasal sprays too much.  You smoke. What are the signs or symptoms? The main symptoms of this condition are pain and a feeling of pressure around the sinuses. Other symptoms include:  Stuffy nose (congestion).  Runny nose (drainage).  Swelling and warmth in the sinuses.  Headache.  Toothache.  A cough that may get worse at night.  Mucus that collects in the throat or the back of the nose (postnasal drip).  Being unable to smell and taste.  Being very tired (fatigue).  A fever.  Sore throat.  Bad breath. How is this diagnosed? This condition is diagnosed based on:  Your symptoms.  Your medical history.  A physical exam.  Tests to find out if your condition is short-term (acute) or long-term (chronic). Your doctor may: ? Check your nose for growths (polyps). ? Check your sinuses using a tool that has a light (endoscope). ? Check for allergies or germs. ? Do imaging tests, such as an MRI or CT scan. How is this treated? Treatment for this condition depends on the cause and whether it is short-term or long-term.  If caused by a  virus, your symptoms should go away on their own within 10 days. You may be given medicines to  relieve symptoms. They include: ? Medicines that shrink swollen tissue in the nose. ? Medicines that treat allergies (antihistamines). ? A spray that treats swelling of the nostrils. ? Rinses that help get rid of thick mucus in your nose (nasal saline washes).  If caused by bacteria, your doctor may wait to see if you will get better without treatment. You may be given antibiotic medicine if you have: ? A very bad infection. ? A weak body defense system.  If caused by growths in the nose, you may need to have surgery. Follow these instructions at home: Medicines  Take, use, or apply over-the-counter and prescription medicines only as told by your doctor. These may include nasal sprays.  If you were prescribed an antibiotic medicine, take it as told by your doctor. Do not stop taking the antibiotic even if you start to feel better. Hydrate and humidify   Drink enough water to keep your pee (urine) pale yellow.  Use a cool mist humidifier to keep the humidity level in your home above 50%.  Breathe in steam for 10-15 minutes, 3-4 times a day, or as told by your doctor. You can do this in the bathroom while a hot shower is running.  Try not to spend time in cool or dry air. Rest  Rest as much as you can.  Sleep with your head raised (elevated).  Make sure you get enough sleep each night. General instructions   Put a warm, moist washcloth on your face 3-4 times a day, or as often as told by your doctor. This will help with discomfort.  Wash your hands often with soap and water. If there is no soap and water, use hand sanitizer.  Do not smoke. Avoid being around people who are smoking (secondhand smoke).  Keep all follow-up visits as told by your doctor. This is important. Contact a doctor if:  You have a fever.  Your symptoms get worse.  Your symptoms do not get better within 10 days. Get help right away if:  You have a very bad headache.  You cannot stop throwing  up (vomiting).  You have very bad pain or swelling around your face or eyes.  You have trouble seeing.  You feel confused.  Your neck is stiff.  You have trouble breathing. Summary  Sinusitis is swelling of your sinuses. Sinuses are hollow spaces in the bones around your face.  This condition is caused by tissues in your nose that become inflamed or swollen. This traps germs. These can lead to infection.  If you were prescribed an antibiotic medicine, take it as told by your doctor. Do not stop taking it even if you start to feel better.  Keep all follow-up visits as told by your doctor. This is important. This information is not intended to replace advice given to you by your health care provider. Make sure you discuss any questions you have with your health care provider. Document Released: 01/19/2008 Document Revised: 01/02/2018 Document Reviewed: 01/02/2018 Elsevier Interactive Patient Education  2019 Elsevier Inc.      Edwina Barth, MD [facility]

## 2018-10-19 NOTE — Telephone Encounter (Signed)
Left message in voice mail of mobile number, the doctor reprinted your AVS with you medications on it. The medications were e-scribed to your pharmacy [facility] on [address].

## 2019-04-20 ENCOUNTER — Telehealth: Payer: BC Managed Care – PPO | Admitting: Nurse Practitioner

## 2019-04-20 ENCOUNTER — Other Ambulatory Visit: Payer: Self-pay

## 2019-04-20 ENCOUNTER — Encounter: Payer: Self-pay | Admitting: Nurse Practitioner

## 2019-04-20 ENCOUNTER — Encounter (INDEPENDENT_AMBULATORY_CARE_PROVIDER_SITE_OTHER): Payer: Self-pay

## 2019-04-20 DIAGNOSIS — J029 Acute pharyngitis, unspecified: Secondary | ICD-10-CM | POA: Diagnosis not present

## 2019-04-20 DIAGNOSIS — R509 Fever, unspecified: Secondary | ICD-10-CM

## 2019-04-20 DIAGNOSIS — R519 Headache, unspecified: Secondary | ICD-10-CM

## 2019-04-20 DIAGNOSIS — R197 Diarrhea, unspecified: Secondary | ICD-10-CM

## 2019-04-20 DIAGNOSIS — R51 Headache: Secondary | ICD-10-CM | POA: Diagnosis not present

## 2019-04-20 DIAGNOSIS — Z20822 Contact with and (suspected) exposure to covid-19: Secondary | ICD-10-CM

## 2019-04-20 DIAGNOSIS — R6889 Other general symptoms and signs: Secondary | ICD-10-CM

## 2019-04-20 MED ORDER — PROMETHAZINE-DM 6.25-15 MG/5ML PO SYRP: 5.0000 mL | ORAL_SOLUTION | Freq: Four times a day (QID) | ORAL | 0 refills | Status: AC | PRN

## 2019-04-20 NOTE — Progress Notes (Signed)
E-Visit for Corona Virus Screening   Your current symptoms could be consistent with the coronavirus.  Many health care providers can now test patients at their office but not all are.  [facility] has multiple testing sites. For information on our COVID testing locations and hours go to [email or web address]  Please quarantine yourself while awaiting your test results.  We are enrolling you in our MyChart Home Montioring for COVID19 . Daily you will receive a questionnaire within the MyChart website. Our COVID 19 response team willl be monitoriing your responses daily.    COVID-19 is a respiratory illness with symptoms that are similar to the flu. Symptoms are typically mild to moderate, but there have been cases of severe illness and death due to the virus. The following symptoms may appear 2-14 days after exposure: . Fever . Cough . Shortness of breath or difficulty breathing . Chills . Repeated shaking with chills . Muscle pain . Headache . Sore throat . New loss of taste or smell . Fatigue . Congestion or runny nose . Nausea or vomiting . Diarrhea  It is vitally important that if you feel that you have an infection such as this virus or any other virus that you stay home and away from places where you may spread it to others.  You should self-quarantine for 14 days if you have symptoms that could potentially be coronavirus or have been in close contact a with a person diagnosed with COVID-19 within the last 2 weeks. You should avoid contact with people age 65 and older.   You should wear a mask or cloth face covering over your nose and mouth if you must be around other people or animals, including pets (even at home). Try to stay at least 6 feet away from other people. This will protect the people around you.  You can use medication such as A prescription cough medication called Phenergan DM 6.25 mg/15 mg. You make take one teaspoon / 5 ml every 4-6 hours as  needed for cough  You may also take acetaminophen (Tylenol) as needed for fever.   Reduce your risk of any infection by using the same precautions used for avoiding the common cold or flu:  . Wash your hands often with soap and warm water for at least 20 seconds.  If soap and water are not readily available, use an alcohol-based hand sanitizer with at least 60% alcohol.  . If coughing or sneezing, cover your mouth and nose by coughing or sneezing into the elbow areas of your shirt or coat, into a tissue or into your sleeve (not your hands). . Avoid shaking hands with others and consider head nods or verbal greetings only. . Avoid touching your eyes, nose, or mouth with unwashed hands.  . Avoid close contact with people who are sick. . Avoid places or events with large numbers of people in one location, like concerts or sporting events. . Carefully consider travel plans you have or are making. . If you are planning any travel outside or inside the [place], visit the CDC's Travelers' Health webpage for the latest health notices. . If you have some symptoms but not all symptoms, continue to monitor at home and seek medical attention if your symptoms worsen. . If you are having a medical emergency, call 911.  HOME CARE . Only take medications as instructed by your medical team. . Drink plenty of fluids and get plenty of rest. . A steam or ultrasonic humidifier can help if you   have congestion.   GET HELP RIGHT AWAY IF YOU HAVE EMERGENCY WARNING SIGNS** FOR COVID-19. If you or someone is showing any of these signs seek emergency medical care immediately. Call 911 or proceed to your closest emergency facility if: . You develop worsening high fever. . Trouble breathing . Bluish lips or face . Persistent pain or pressure in the chest . New confusion . Inability to wake or stay awake . You cough up blood. . Your symptoms become more severe  **This list is not all possible symptoms. Contact your  medical provider for any symptoms that are sever or concerning to you.   MAKE SURE YOU   Understand these instructions.  Will watch your condition.  Will get help right away if you are not doing well or get worse.  Your e-visit answers were reviewed by a board certified advanced clinical practitioner to complete your personal care plan.  Depending on the condition, your plan could have included both over the counter or prescription medications.  If there is a problem please reply once you have received a response from your provider.  Your safety is important to us.  If you have drug allergies check your prescription carefully.    You can use MyChart to ask questions about today's visit, request a non-urgent call back, or ask for a work or school excuse for 24 hours related to this e-Visit. If it has been greater than 24 hours you will need to follow up with your provider, or enter a new e-Visit to address those concerns. You will get an e-mail in the next two days asking about your experience.  I hope that your e-visit has been valuable and will speed your recovery. Thank you for using e-visits.    

## 2019-04-21 ENCOUNTER — Encounter (INDEPENDENT_AMBULATORY_CARE_PROVIDER_SITE_OTHER): Payer: Self-pay

## 2019-04-22 ENCOUNTER — Encounter (INDEPENDENT_AMBULATORY_CARE_PROVIDER_SITE_OTHER): Payer: Self-pay

## 2019-04-22 LAB — NOVEL CORONAVIRUS, NAA: SARS-CoV-2, NAA: NOT DETECTED

## 2019-04-27 NOTE — Progress Notes (Signed)
Approximately 5 minutes were spent reviewing and documenting in the patient's chart. 

## 2019-05-21 ENCOUNTER — Other Ambulatory Visit: Payer: Self-pay

## 2019-05-21 DIAGNOSIS — Z20822 Contact with and (suspected) exposure to covid-19: Secondary | ICD-10-CM

## 2019-05-23 LAB — NOVEL CORONAVIRUS, NAA: SARS-CoV-2, NAA: NOT DETECTED

## 2019-06-12 ENCOUNTER — Other Ambulatory Visit: Payer: Self-pay

## 2019-06-12 DIAGNOSIS — Z20822 Contact with and (suspected) exposure to covid-19: Secondary | ICD-10-CM

## 2019-06-14 LAB — NOVEL CORONAVIRUS, NAA: SARS-CoV-2, NAA: NOT DETECTED

## 2019-09-20 ENCOUNTER — Ambulatory Visit: Payer: BC Managed Care – PPO | Attending: Internal Medicine

## 2019-09-20 DIAGNOSIS — Z20822 Contact with and (suspected) exposure to covid-19: Secondary | ICD-10-CM | POA: Insufficient documentation

## 2019-09-21 LAB — NOVEL CORONAVIRUS, NAA: SARS-CoV-2, NAA: NOT DETECTED

## 2019-10-01 ENCOUNTER — Ambulatory Visit: Payer: BC Managed Care – PPO | Attending: Internal Medicine

## 2019-10-01 ENCOUNTER — Other Ambulatory Visit: Payer: Self-pay

## 2019-10-01 DIAGNOSIS — Z23 Encounter for immunization: Secondary | ICD-10-CM | POA: Insufficient documentation

## 2019-10-01 NOTE — Progress Notes (Signed)
   Covid-19 Vaccination [facility]  Name:  [name]    MRN: [id number] DOB: 09/16/1985  10/01/2019  Ms. Pair was observed post Covid-19 immunization for 15 minutes without incidence. She was provided with Vaccine Information Sheet and instruction to access the V-Safe system.   Norma Gray was instructed to call 911 with any severe reactions post vaccine: Marland Kitchen Difficulty breathing  . Swelling of your face and throat  . A fast heartbeat  . A bad rash all over your body  . Dizziness and weakness    Immunizations Administered    Name Date Dose VIS Date Route   Moderna COVID-19 Vaccine 10/01/2019  3:51 PM 0.5 mL 07/17/2019 Intramuscular   Manufacturer: Moderna   Lot: [id number]   NDC: 94854-627-03

## 2019-10-30 ENCOUNTER — Ambulatory Visit: Payer: BC Managed Care – PPO | Attending: Internal Medicine

## 2019-10-30 DIAGNOSIS — Z23 Encounter for immunization: Secondary | ICD-10-CM

## 2019-11-05 ENCOUNTER — Encounter: Payer: Self-pay | Admitting: Emergency Medicine

## 2019-11-05 ENCOUNTER — Telehealth (INDEPENDENT_AMBULATORY_CARE_PROVIDER_SITE_OTHER): Payer: BC Managed Care – PPO | Admitting: Emergency Medicine

## 2019-11-05 ENCOUNTER — Other Ambulatory Visit: Payer: Self-pay

## 2019-11-05 VITALS — Ht 64.0 in | Wt 160.0 lb

## 2019-11-05 DIAGNOSIS — J0100 Acute maxillary sinusitis, unspecified: Secondary | ICD-10-CM | POA: Diagnosis not present

## 2019-11-05 DIAGNOSIS — J3489 Other specified disorders of nose and nasal sinuses: Secondary | ICD-10-CM

## 2019-11-05 DIAGNOSIS — R0981 Nasal congestion: Secondary | ICD-10-CM

## 2019-11-05 MED ORDER — PSEUDOEPHEDRINE-GUAIFENESIN ER 60-600 MG PO TB12: 1.0000 | ORAL_TABLET | Freq: Two times a day (BID) | ORAL | 1 refills | Status: AC

## 2019-11-05 MED ORDER — TRIAMCINOLONE ACETONIDE 55 MCG/ACT NA AERO: 2.0000 | INHALATION_SPRAY | Freq: Every day | NASAL | 12 refills | Status: AC

## 2019-11-05 MED ORDER — AZITHROMYCIN 250 MG PO TABS: ORAL_TABLET | ORAL | 0 refills | Status: AC

## 2019-11-05 NOTE — Progress Notes (Signed)
Sinus pressure and congestion from seasonal allergies (2-3 days)  Tried zyrtec and home remedies

## 2019-11-05 NOTE — Patient Instructions (Signed)
° ° ° °  If you have lab work done today you will be contacted with your lab results within the next 2 weeks.  If you have not heard from us then please contact us. The fastest way to get your results is to register for My Chart. ° ° °IF you received an x-ray today, you will receive an invoice from [facility]. Please contact [facility] at [phone] with questions or concerns regarding your invoice.  ° °IF you received labwork today, you will receive an invoice from [facility]. Please contact [facility] at [phone] with questions or concerns regarding your invoice.  ° °Our billing staff will not be able to assist you with questions regarding bills from these companies. ° °You will be contacted with the lab results as soon as they are available. The fastest way to get your results is to activate your My Chart account. Instructions are located on the last page of this paperwork. If you have not heard from us regarding the results in 2 weeks, please contact this office. °  ° ° ° °

## 2019-11-05 NOTE — Progress Notes (Signed)
Telemedicine Encounter- SOAP NOTE Established Patient MyChart video conference This video telephone encounter was conducted with the patient's (or proxy's) verbal consent via video audio telecommunications: yes/no: Yes Patient was instructed to have this encounter in a suitably private space; and to only have persons present to whom they give permission to participate. In addition, patient identity was confirmed by use of name plus two identifiers (DOB and address).  I discussed the limitations, risks, security and privacy concerns of performing an evaluation and management service by telephone and the availability of in person appointments. I also discussed with the patient that there may be a patient responsible charge related to this service. The patient expressed understanding and agreed to proceed.  I spent a total of TIME; 0 MIN TO 60 MIN: 15 minutes talking with the patient or their proxy.  Chief Complaint  Patient presents with  . sesonal allergies    Subjective   Norma Gray is a 34 y.o. female established patient. Telephone visit today complaining of sinus symptoms.  Has a history of seasonal allergies.  Complaining of sinus pain and congestion for the past 3 to 4 days.  Has been taking Zyrtec with little relief. Fully vaccinated against Covid.  No other significant symptoms.  HPI   Patient Active Problem List   Diagnosis Date Noted  . Acute non-recurrent maxillary sinusitis 10/19/2018  . Sinus congestion 10/19/2018  . Bipolar 2 disorder (HCC) 01/03/2018  . Need for prophylactic vaccination and inoculation against influenza 05/17/2017  . Sore throat 05/17/2017  . Acute pharyngitis 05/17/2017    Past Medical History:  Diagnosis Date  . Allergy   . Depression     Current Outpatient Medications  Medication Sig Dispense Refill  . cetirizine (ZYRTEC) 10 MG tablet Take 10 mg by mouth daily.    Marland Kitchen FLUoxetine (PROZAC) 10 MG tablet Take 5 mg by mouth daily.     Marland Kitchen  lamoTRIgine (LAMICTAL) 150 MG tablet Take 300 mg by mouth daily.     . Levonorgestrel (SKYLA) 13.5 MG IUD by Intrauterine route.     No current facility-administered medications for this visit.    No Known Allergies  Social History   Socioeconomic History  . Marital status: Married    Spouse name: Not on file  . Number of children: 0  . Years of education: Not on file  . Highest education level: Not on file  Occupational History  . Not on file  Tobacco Use  . Smoking status: Never Smoker  . Smokeless tobacco: Never Used  Substance and Sexual Activity  . Alcohol use: Yes    Comment: 1-2 per week  . Drug use: No  . Sexual activity: Yes    Birth control/protection: Implant  Other Topics Concern  . Not on file  Social History Narrative   Lives in [place]. From [place]. Works in Nurse, mental health.    Social Determinants of Health   Financial Resource Strain:   . Difficulty of Paying Living Expenses:   Food Insecurity:   . Worried About Programme researcher, broadcasting/film/video in the Last Year:   . Barista in the Last Year:   Transportation Needs:   . Freight forwarder (Medical):   Marland Kitchen Lack of Transportation (Non-Medical):   Physical Activity:   . Days of Exercise per Week:   . Minutes of Exercise per Session:   Stress:   . Feeling of Stress :   Social Connections:   . Frequency of  Communication with Friends and Family:   . Frequency of Social Gatherings with Friends and Family:   . Attends Religious Services:   . Active Member of Clubs or Organizations:   . Attends Archivist Meetings:   Marland Kitchen Marital Status:   Intimate Partner Violence:   . Fear of Current or Ex-Partner:   . Emotionally Abused:   Marland Kitchen Physically Abused:   . Sexually Abused:     Review of Systems  Constitutional: Negative.  Negative for chills and fever.  HENT: Positive for congestion and sinus pain. Negative for ear pain and sore throat.   Respiratory: Negative.  Negative for cough and  shortness of breath.   Cardiovascular: Negative.  Negative for chest pain and palpitations.  Gastrointestinal: Negative.  Negative for abdominal pain, diarrhea, nausea and vomiting.  Genitourinary: Negative.  Negative for dysuria and hematuria.  Musculoskeletal: Negative.  Negative for back pain, myalgias and neck pain.  Skin: Negative.  Negative for rash.  Neurological: Negative.  Negative for dizziness and headaches.  All other systems reviewed and are negative.  Alert and oriented x3 in no apparent respiratory distress. Objective   Vitals as reported by the patient: Today's Vitals   11/05/19 1649  Weight: 160 lb (72.6 kg)  Height: 5\' 4"  (1.626 m)    There are no diagnoses linked to this encounter. [employer] was seen today for sesonal allergies.  Diagnoses and all orders for this visit:  Sinus pressure  Sinus congestion -     pseudoephedrine-guaifenesin (MUCINEX D) 60-600 MG 12 hr tablet; Take 1 tablet by mouth every 12 (twelve) hours for 5 days. -     triamcinolone (NASACORT) 55 MCG/ACT AERO nasal inhaler; Place 2 sprays into the nose daily.  Acute non-recurrent maxillary sinusitis -     azithromycin (ZITHROMAX) 250 MG tablet; Sig as indicated     I discussed the assessment and treatment plan with the patient. The patient was provided an opportunity to ask questions and all were answered. The patient agreed with the plan and demonstrated an understanding of the instructions.   The patient was advised to call back or seek an in-person evaluation if the symptoms worsen or if the condition fails to improve as anticipated.  I provided 15 minutes of non-face-to-face time during this encounter.  Horald Pollen, MD  [facility]
